# Patient Record
Sex: Female | Born: 1990 | Hispanic: No | Marital: Single | Smoking: Never smoker
Health system: Southern US, Community
[De-identification: ages and names within clinical notes are randomized; demographics above are authoritative.]

## PROBLEM LIST (undated history)

## (undated) ENCOUNTER — Inpatient Hospital Stay (HOSPITAL_COMMUNITY): Payer: Self-pay

## (undated) DIAGNOSIS — Z789 Other specified health status: Secondary | ICD-10-CM

## (undated) HISTORY — DX: Other specified health status: Z78.9

## (undated) HISTORY — PX: NO PAST SURGERIES: SHX2092

---

## 2012-01-31 NOTE — L&D Delivery Note (Signed)
Delivery Note At 3:09 AM a healthy female was delivered via Vaginal, Spontaneous Delivery (Presentation:ROA; ).  APGAR: 9, 9; weight 6 lb 11.1 oz (3036 g).   Placenta status: Intact, Spontaneous.  Cord: 3 vessels with the following complications: None.  Cord pH:n/a  Anesthesia: None  Episiotomy: None Lacerations: None Suture Repair: n/a Est. Blood Loss (mL): 300  Mom to postpartum.  Baby to nursery-stable.  Kevin Fenton 04/24/2012, 4:27 AM

## 2012-01-31 NOTE — L&D Delivery Note (Signed)
I have seen and examined this patient and I agree with the above. Cam Hai 7:50 AM 04/24/2012

## 2012-04-08 ENCOUNTER — Inpatient Hospital Stay (HOSPITAL_COMMUNITY)
Admission: AD | Admit: 2012-04-08 | Discharge: 2012-04-08 | Discharge status: Against Medical Advice | Payer: Self-pay | Source: Ambulatory Visit | Attending: Obstetrics & Gynecology | Admitting: Obstetrics & Gynecology

## 2012-04-08 NOTE — MAU Note (Signed)
Patient is not in the lobby when called to triage.  

## 2012-04-08 NOTE — MAU Note (Signed)
Patient is not in the lobby when called to triage. Admissions staff informed me that she left and will come back another day.

## 2012-04-24 ENCOUNTER — Encounter (HOSPITAL_COMMUNITY): Payer: Self-pay | Admitting: *Deleted

## 2012-04-24 ENCOUNTER — Inpatient Hospital Stay (HOSPITAL_COMMUNITY)
Admission: AD | Admit: 2012-04-24 | Discharge: 2012-04-25 | DRG: 775 | Disposition: A | Discharge status: Home / Self Care | Payer: Medicaid Other | Source: Ambulatory Visit | Attending: Family Medicine | Admitting: Family Medicine

## 2012-04-24 LAB — RAPID HIV SCREEN (WH-MAU): Rapid HIV Screen: NONREACTIVE

## 2012-04-24 LAB — CBC
Hemoglobin: 9.7 g/dL — ABNORMAL LOW (ref 12.0–15.0)
MCH: 29.5 pg (ref 26.0–34.0)
MCV: 86.3 fL (ref 78.0–100.0)
RBC: 3.29 MIL/uL — ABNORMAL LOW (ref 3.87–5.11)

## 2012-04-24 LAB — RAPID URINE DRUG SCREEN, HOSP PERFORMED: Opiates: NOT DETECTED

## 2012-04-24 LAB — TYPE AND SCREEN
ABO/RH(D): O POS
Antibody Screen: NEGATIVE

## 2012-04-24 MED ORDER — DIBUCAINE 1 % RE OINT: 1.0000 "application " | TOPICAL_OINTMENT | RECTAL | Status: DC | PRN

## 2012-04-24 MED ORDER — LACTATED RINGERS IV SOLN: 500.0000 mL | INTRAVENOUS | Status: DC | PRN

## 2012-04-24 MED ORDER — ZOLPIDEM TARTRATE 5 MG PO TABS: 5.0000 mg | ORAL_TABLET | Freq: Every evening | ORAL | Status: DC | PRN

## 2012-04-24 MED ORDER — ONDANSETRON HCL 4 MG PO TABS: 4.0000 mg | ORAL_TABLET | ORAL | Status: DC | PRN

## 2012-04-24 MED ORDER — WITCH HAZEL-GLYCERIN EX PADS: 1.0000 "application " | MEDICATED_PAD | CUTANEOUS | Status: DC | PRN

## 2012-04-24 MED ORDER — LANOLIN HYDROUS EX OINT: TOPICAL_OINTMENT | CUTANEOUS | Status: DC | PRN

## 2012-04-24 MED ORDER — DIPHENHYDRAMINE HCL 25 MG PO CAPS: 25.0000 mg | ORAL_CAPSULE | Freq: Four times a day (QID) | ORAL | Status: DC | PRN

## 2012-04-24 MED ORDER — FENTANYL CITRATE 0.05 MG/ML IJ SOLN
100.0000 ug | INTRAMUSCULAR | Status: DC | PRN
  Administered 2012-04-24: 100 ug via INTRAVENOUS
  Filled 2012-04-24: qty 2

## 2012-04-24 MED ORDER — PRENATAL MULTIVITAMIN CH
1.0000 | ORAL_TABLET | Freq: Every day | ORAL | Status: DC
  Administered 2012-04-24 – 2012-04-25 (×2): 1 via ORAL
  Filled 2012-04-24 (×2): qty 1

## 2012-04-24 MED ORDER — OXYCODONE-ACETAMINOPHEN 5-325 MG PO TABS
1.0000 | ORAL_TABLET | ORAL | Status: DC | PRN
  Administered 2012-04-24 (×3): 1 via ORAL
  Filled 2012-04-24 (×3): qty 1

## 2012-04-24 MED ORDER — ACETAMINOPHEN 325 MG PO TABS: 650.0000 mg | ORAL_TABLET | ORAL | Status: DC | PRN

## 2012-04-24 MED ORDER — OXYTOCIN BOLUS FROM INFUSION
500.0000 mL | INTRAVENOUS | Status: DC
  Administered 2012-04-24: 500 mL via INTRAVENOUS

## 2012-04-24 MED ORDER — SENNOSIDES-DOCUSATE SODIUM 8.6-50 MG PO TABS
2.0000 | ORAL_TABLET | Freq: Every day | ORAL | Status: DC
  Administered 2012-04-24: 2 via ORAL

## 2012-04-24 MED ORDER — CITRIC ACID-SODIUM CITRATE 334-500 MG/5ML PO SOLN: 30.0000 mL | ORAL | Status: DC | PRN

## 2012-04-24 MED ORDER — IBUPROFEN 600 MG PO TABS
600.0000 mg | ORAL_TABLET | Freq: Four times a day (QID) | ORAL | Status: DC
  Administered 2012-04-24 – 2012-04-25 (×5): 600 mg via ORAL
  Filled 2012-04-24 (×5): qty 1

## 2012-04-24 MED ORDER — OXYTOCIN 40 UNITS IN LACTATED RINGERS INFUSION - SIMPLE MED
62.5000 mL/h | INTRAVENOUS | Status: DC
  Filled 2012-04-24: qty 1000

## 2012-04-24 MED ORDER — LIDOCAINE HCL (PF) 1 % IJ SOLN
30.0000 mL | INTRAMUSCULAR | Status: DC | PRN
  Filled 2012-04-24 (×2): qty 30

## 2012-04-24 MED ORDER — SODIUM CHLORIDE 0.9 % IV SOLN
2.0000 g | Freq: Four times a day (QID) | INTRAVENOUS | Status: DC
  Administered 2012-04-24: 2 g via INTRAVENOUS
  Filled 2012-04-24 (×3): qty 2000

## 2012-04-24 MED ORDER — ONDANSETRON HCL 4 MG/2ML IJ SOLN: 4.0000 mg | Freq: Four times a day (QID) | INTRAMUSCULAR | Status: DC | PRN

## 2012-04-24 MED ORDER — OXYCODONE-ACETAMINOPHEN 5-325 MG PO TABS: 1.0000 | ORAL_TABLET | ORAL | Status: DC | PRN

## 2012-04-24 MED ORDER — LACTATED RINGERS IV SOLN
INTRAVENOUS | Status: DC
  Administered 2012-04-24: 02:00:00 via INTRAVENOUS

## 2012-04-24 MED ORDER — ONDANSETRON HCL 4 MG/2ML IJ SOLN: 4.0000 mg | INTRAMUSCULAR | Status: DC | PRN

## 2012-04-24 MED ORDER — TETANUS-DIPHTH-ACELL PERTUSSIS 5-2.5-18.5 LF-MCG/0.5 IM SUSP: 0.5000 mL | Freq: Once | INTRAMUSCULAR | Status: DC

## 2012-04-24 MED ORDER — IBUPROFEN 600 MG PO TABS
600.0000 mg | ORAL_TABLET | Freq: Four times a day (QID) | ORAL | Status: DC | PRN
  Administered 2012-04-24: 600 mg via ORAL
  Filled 2012-04-24: qty 1

## 2012-04-24 MED ORDER — SIMETHICONE 80 MG PO CHEW: 80.0000 mg | CHEWABLE_TABLET | ORAL | Status: DC | PRN

## 2012-04-24 MED ORDER — BENZOCAINE-MENTHOL 20-0.5 % EX AERO: 1.0000 "application " | INHALATION_SPRAY | CUTANEOUS | Status: DC | PRN

## 2012-04-24 NOTE — Progress Notes (Signed)
CSW referral received to assess pt's reason for NPNC. Pt told CSW that she was unable to obtain insurance, as she does not have a Green Card. She denies any illegal substance use, after CSW explained hospital drug testing policy. UDS is negative, meconium results are pending. Pt has all the necessary supplies for the infant & appears to be bonding appropriately. FOB, Christopher Bryant was at the bedside & supportive. Pt has WIC & Food Stamp benefits. CSW will continue to monitor drug screen results & make a referral if needed.      

## 2012-04-24 NOTE — H&P (Signed)
Heather Obrien is a 22 y.o. female G2P1 presenting for labor at 39 weeks by 17 week [place]. Her contractions started around 10 this morning. She has had some bloody mucousy discharge today. She denies LOF and decreased fetal movement. She recently moved from [place] where she did not get pre-natal care. She says that she had a 17 week Korea which was completely normal. She last ate around 6 pm. She denies other medical problems or problems during this pregnancy and denies problems during her last pregnancy.    History OB History   Grav Para Term Preterm Abortions TAB SAB Ect Mult Living   2              History reviewed. No pertinent past medical history. History reviewed. No pertinent past surgical history. Family History: family history is not on file. Social History:  reports that she has never smoked. She does not have any smokeless tobacco history on file. She reports that she does not drink alcohol or use illicit drugs.   Prenatal Transfer Tool  Maternal Diabetes: No Genetic Screening: Declined Maternal Ultrasounds/Referrals: Normal Fetal Ultrasounds or other Referrals:  None Maternal Substance Abuse:  No Significant Maternal Medications:  None Significant Maternal Lab Results:  None Other Comments:  Per mother's History, no pre-natal care  ROS Per HPI  Dilation: 6.5 Effacement (%): 90 Station: 0 Exam by:: B.Bethea RN There were no vitals taken for this visit. Exam Physical Exam   Gen: NAD, alert, cooperative with exam HEENT: NCAT CV: RRR, good S1/S2, no murmur Resp: CTABL, no wheezes, non-labored Abd: soft, pregnant abdomen Ext: No edema Neuro: Alert and oriented, No gross deficits  FHT: baseline 130, moderate variability, accels present, no decels Toco: regular q 3 minutes  Prenatal labs: ABO, Rh:   All pending currently Antibody:   Rubella:   RPR:    HBsAg:    HIV:    GBS:     Assessment/Plan: 22 y/o G2P0 here in active labor with no prenatal care -  Active labor - category 1 fetal strip - OB panel, UDS pending - Pain: Fentanyl PRN, Epidural by request - GBS unknown, no allergies, Treat with ampicillin - Anticipate SVD   Kevin Fenton 04/24/2012, 2:19 AM  I have seen and examined this patient and I agree with the above.  Rapid HIV: NR  CBC    Component Value Date/Time   WBC 8.1 04/24/2012 0240   RBC 3.29* 04/24/2012 0240   HGB 9.7* 04/24/2012 0240   HCT 28.4* 04/24/2012 0240   PLT 203 04/24/2012 0240   MCV 86.3 04/24/2012 0240   MCH 29.5 04/24/2012 0240   MCHC 34.2 04/24/2012 0240   RDW 13.7 04/24/2012 0240   Other labs pending. Has received a dose of Amp, but will not give another as is GBS unk at term by 17wk scan. Cam Hai 3:27 AM 04/24/2012

## 2012-04-24 NOTE — Progress Notes (Signed)
UR chart review completed.  

## 2012-04-24 NOTE — H&P (Signed)
Chart reviewed and agree with management and plan.  

## 2012-04-25 MED ORDER — INFLUENZA VIRUS VACC SPLIT PF IM SUSP
0.5000 mL | INTRAMUSCULAR | Status: AC
  Administered 2012-04-25: 0.5 mL via INTRAMUSCULAR

## 2012-04-25 MED ORDER — IBUPROFEN 600 MG PO TABS: 600.0000 mg | ORAL_TABLET | Freq: Four times a day (QID) | ORAL | Status: DC

## 2012-04-25 NOTE — Discharge Summary (Signed)
Obstetric Discharge Summary Pt reports some tenderness in her belly, particularly after baby has been laying on her breastfeeding for a while. Back is sore from being in bed. Otherwise no concerns. Pt is ready to go home. She does not live in [place] and came here because FOB's family is here. Will not be here in 6 weeks for a follow-up.  Reason for Admission: onset of labor Prenatal Procedures: none Intrapartum Procedures: spontaneous vaginal delivery Postpartum Procedures: none Complications-Operative and Postpartum: none Hemoglobin  Date Value Range Status  04/24/2012 9.7* 12.0 - 15.0 g/dL Final     HCT  Date Value Range Status  04/24/2012 28.4* 36.0 - 46.0 % Final    Physical Exam:  General: alert, cooperative, appears stated age and no distress Lochia: appropriate Uterine Fundus: firm Incision: n/a DVT Evaluation: No evidence of DVT seen on physical exam.  Discharge Diagnoses: Term Pregnancy-delivered  Discharge Information: Date: 04/25/2012 Activity: unrestricted Diet: routine Medications: Ibuprofen Condition: stable Instructions: refer to practice specific booklet Discharge to: home   Newborn Data: Live born female  Birth Weight: 6 lb 11.1 oz (3036 g) APGAR: 9, 9  Home with mother. Recommended trying different positions while breastfeeding and alternating breasts to help with belly soreness. Recommended walking in the halls to help alleviate back pain Follow-up in 6 weeks with a provider where pt is currently living  Depo shot in 6 weeks. Practice other forms of birth control in the meantime.  Heather Obrien 04/25/2012, 7:28 AM  I have seen and examined this patient and agree the above assessment. Heather Obrien,Heather Obrien 04/25/2012 7:44 AM

## 2013-01-30 NOTE — L&D Delivery Note (Addendum)
Delivery Note At 9:00 PM a viable female was delivered via Vaginal, Spontaneous Delivery (Presentation: Left Occiput Anterior).  APGAR: 9, 9; weight 7 lb 1.2 oz (3210 g).   Placenta status: Intact, Spontaneous.  Cord: 3 vessels with the following complications: None.  Cord pH: n/a  Anesthesia: None  Episiotomy: None Lacerations: None Suture Repair: n/a Est. Blood Loss (mL): 300  Mom to postpartum.  Baby to Couplet care / Skin to Skin.  Jacquiline Doearker, Boots Mcglown 12/19/2013, 1:12 PM

## 2013-09-11 ENCOUNTER — Other Ambulatory Visit (HOSPITAL_COMMUNITY)
Admission: RE | Admit: 2013-09-11 | Discharge: 2013-09-11 | Disposition: A | Discharge status: Home / Self Care | Payer: Medicaid Other | Source: Ambulatory Visit | Attending: Obstetrics and Gynecology | Admitting: Obstetrics and Gynecology

## 2013-09-11 ENCOUNTER — Ambulatory Visit (INDEPENDENT_AMBULATORY_CARE_PROVIDER_SITE_OTHER): Payer: Medicaid Other | Admitting: Obstetrics and Gynecology

## 2013-09-11 ENCOUNTER — Ambulatory Visit (HOSPITAL_COMMUNITY)
Admission: RE | Admit: 2013-09-11 | Discharge: 2013-09-11 | Disposition: A | Discharge status: Home / Self Care | Payer: Medicaid Other | Source: Ambulatory Visit | Attending: Obstetrics and Gynecology | Admitting: Obstetrics and Gynecology

## 2013-09-11 ENCOUNTER — Encounter: Payer: Self-pay | Admitting: Obstetrics and Gynecology

## 2013-09-11 VITALS — BP 100/51 | HR 82 | Temp 98.0°F | Wt 114.8 lb

## 2013-09-11 DIAGNOSIS — Z3480 Encounter for supervision of other normal pregnancy, unspecified trimester: Secondary | ICD-10-CM

## 2013-09-11 DIAGNOSIS — Z3492 Encounter for supervision of normal pregnancy, unspecified, second trimester: Secondary | ICD-10-CM | POA: Insufficient documentation

## 2013-09-11 DIAGNOSIS — Z113 Encounter for screening for infections with a predominantly sexual mode of transmission: Secondary | ICD-10-CM | POA: Diagnosis present

## 2013-09-11 DIAGNOSIS — Z01419 Encounter for gynecological examination (general) (routine) without abnormal findings: Secondary | ICD-10-CM | POA: Insufficient documentation

## 2013-09-11 DIAGNOSIS — O0932 Supervision of pregnancy with insufficient antenatal care, second trimester: Secondary | ICD-10-CM

## 2013-09-11 DIAGNOSIS — Z3689 Encounter for other specified antenatal screening: Secondary | ICD-10-CM | POA: Diagnosis present

## 2013-09-11 DIAGNOSIS — Z1389 Encounter for screening for other disorder: Secondary | ICD-10-CM

## 2013-09-11 DIAGNOSIS — Z3201 Encounter for pregnancy test, result positive: Secondary | ICD-10-CM

## 2013-09-11 DIAGNOSIS — O0930 Supervision of pregnancy with insufficient antenatal care, unspecified trimester: Secondary | ICD-10-CM

## 2013-09-11 LAB — POCT URINALYSIS DIP (DEVICE)
BILIRUBIN URINE: NEGATIVE
Glucose, UA: NEGATIVE mg/dL
Hgb urine dipstick: NEGATIVE
Ketones, ur: NEGATIVE mg/dL
NITRITE: NEGATIVE
PH: 5.5 (ref 5.0–8.0)
PROTEIN: NEGATIVE mg/dL
Specific Gravity, Urine: 1.01 (ref 1.005–1.030)
Urobilinogen, UA: 0.2 mg/dL (ref 0.0–1.0)

## 2013-09-11 LAB — POCT PREGNANCY, URINE: Preg Test, Ur: POSITIVE — AB

## 2013-09-11 LAB — OB RESULTS CONSOLE GBS: STREP GROUP B AG: POSITIVE

## 2013-09-11 NOTE — Progress Notes (Signed)
Pt scheduled for anatomy u/s 09/11/13 @ 3:30pm

## 2013-09-11 NOTE — Patient Instructions (Signed)
Second Trimester of Pregnancy The second trimester is from week 13 through week 28, months 4 through 6. The second trimester is often a time when you feel your best. Your body has also adjusted to being pregnant, and you begin to feel better physically. Usually, morning sickness has lessened or quit completely, you may have more energy, and you may have an increase in appetite. The second trimester is also a time when the fetus is growing rapidly. At the end of the sixth month, the fetus is about 9 inches long and weighs about 1 pounds. You will likely begin to feel the baby move (quickening) between 18 and 20 weeks of the pregnancy. BODY CHANGES Your body goes through many changes during pregnancy. The changes vary from woman to woman.   Your weight will continue to increase. You will notice your lower abdomen bulging out.  You may begin to get stretch marks on your hips, abdomen, and breasts.  You may develop headaches that can be relieved by medicines approved by your health care provider.  You may urinate more often because the fetus is pressing on your bladder.  You may develop or continue to have heartburn as a result of your pregnancy.  You may develop constipation because certain hormones are causing the muscles that push waste through your intestines to slow down.  You may develop hemorrhoids or swollen, bulging veins (varicose veins).  You may have back pain because of the weight gain and pregnancy hormones relaxing your joints between the bones in your pelvis and as a result of a shift in weight and the muscles that support your balance.  Your breasts will continue to grow and be tender.  Your gums may bleed and may be sensitive to brushing and flossing.  Dark spots or blotches (chloasma, mask of pregnancy) may develop on your face. This will likely fade after the baby is born.  A dark line from your belly button to the pubic area (linea nigra) may appear. This will likely  fade after the baby is born.  You may have changes in your hair. These can include thickening of your hair, rapid growth, and changes in texture. Some women also have hair loss during or after pregnancy, or hair that feels dry or thin. Your hair will most likely return to normal after your baby is born. WHAT TO EXPECT AT YOUR PRENATAL VISITS During a routine prenatal visit:  You will be weighed to make sure you and the fetus are growing normally.  Your blood pressure will be taken.  Your abdomen will be measured to track your baby's growth.  The fetal heartbeat will be listened to.  Any test results from the previous visit will be discussed. Your health care provider may ask you:  How you are feeling.  If you are feeling the baby move.  If you have had any abnormal symptoms, such as leaking fluid, bleeding, severe headaches, or abdominal cramping.  If you have any questions. Other tests that may be performed during your second trimester include:  Blood tests that check for:  Low iron levels (anemia).  Gestational diabetes (between 24 and 28 weeks).  Rh antibodies.  Urine tests to check for infections, diabetes, or protein in the urine.  An ultrasound to confirm the proper growth and development of the baby.  An amniocentesis to check for possible genetic problems.  Fetal screens for spina bifida and Down syndrome. HOME CARE INSTRUCTIONS   Avoid all smoking, herbs, alcohol, and unprescribed   drugs. These chemicals affect the formation and growth of the baby.  Follow your health care provider's instructions regarding medicine use. There are medicines that are either safe or unsafe to take during pregnancy.  Exercise only as directed by your health care provider. Experiencing uterine cramps is a good sign to stop exercising.  Continue to eat regular, healthy meals.  Wear a good support bra for breast tenderness.  Do not use hot tubs, steam rooms, or saunas.  Wear  your seat belt at all times when driving.  Avoid raw meat, uncooked cheese, cat litter boxes, and soil used by cats. These carry germs that can cause birth defects in the baby.  Take your prenatal vitamins.  Try taking a stool softener (if your health care provider approves) if you develop constipation. Eat more high-fiber foods, such as fresh vegetables or fruit and whole grains. Drink plenty of fluids to keep your urine clear or pale yellow.  Take warm sitz baths to soothe any pain or discomfort caused by hemorrhoids. Use hemorrhoid cream if your health care provider approves.  If you develop varicose veins, wear support hose. Elevate your feet for 15 minutes, 3-4 times a day. Limit salt in your diet.  Avoid heavy lifting, wear low heel shoes, and practice good posture.  Rest with your legs elevated if you have leg cramps or low back pain.  Visit your dentist if you have not gone yet during your pregnancy. Use a soft toothbrush to brush your teeth and be gentle when you floss.  A sexual relationship may be continued unless your health care provider directs you otherwise.  Continue to go to all your prenatal visits as directed by your health care provider. SEEK MEDICAL CARE IF:   You have dizziness.  You have mild pelvic cramps, pelvic pressure, or nagging pain in the abdominal area.  You have persistent nausea, vomiting, or diarrhea.  You have a bad smelling vaginal discharge.  You have pain with urination. SEEK IMMEDIATE MEDICAL CARE IF:   You have a fever.  You are leaking fluid from your vagina.  You have spotting or bleeding from your vagina.  You have severe abdominal cramping or pain.  You have rapid weight gain or loss.  You have shortness of breath with chest pain.  You notice sudden or extreme swelling of your face, hands, ankles, feet, or legs.  You have not felt your baby move in over an hour.  You have severe headaches that do not go away with  medicine.  You have vision changes. Document Released: 01/10/2001 Document Revised: 01/21/2013 Document Reviewed: 03/19/2012 [employer] Patient Information 2015 [employer]. This information is not intended to replace advice given to you by your health care provider. Make sure you discuss any questions you have with your health care provider.  Contraception Choices Contraception (birth control) is the use of any methods or devices to prevent pregnancy. Below are some methods to help avoid pregnancy. HORMONAL METHODS   Contraceptive implant. This is a thin, plastic tube containing progesterone hormone. It does not contain estrogen hormone. Your health care provider inserts the tube in the inner part of the upper arm. The tube can remain in place for up to 3 years. After 3 years, the implant must be removed. The implant prevents the ovaries from releasing an egg (ovulation), thickens the cervical mucus to prevent sperm from entering the uterus, and thins the lining of the inside of the uterus.  Progesterone-only injections. These injections are given   every 3 months by your health care provider to prevent pregnancy. This synthetic progesterone hormone stops the ovaries from releasing eggs. It also thickens cervical mucus and changes the uterine lining. This makes it harder for sperm to survive in the uterus.  Birth control pills. These pills contain estrogen and progesterone hormone. They work by preventing the ovaries from releasing eggs (ovulation). They also cause the cervical mucus to thicken, preventing the sperm from entering the uterus. Birth control pills are prescribed by a health care provider.Birth control pills can also be used to treat heavy periods.  Minipill. This type of birth control pill contains only the progesterone hormone. They are taken every day of each month and must be prescribed by your health care provider.  Birth control patch. The patch contains hormones similar to  those in birth control pills. It must be changed once a week and is prescribed by a health care provider.  Vaginal ring. The ring contains hormones similar to those in birth control pills. It is left in the vagina for 3 weeks, removed for 1 week, and then a new one is put back in place. The patient must be comfortable inserting and removing the ring from the vagina.A health care provider's prescription is necessary.  Emergency contraception. Emergency contraceptives prevent pregnancy after unprotected sexual intercourse. This pill can be taken right after sex or up to 5 days after unprotected sex. It is most effective the sooner you take the pills after having sexual intercourse. Most emergency contraceptive pills are available without a prescription. Check with your pharmacist. Do not use emergency contraception as your only form of birth control. BARRIER METHODS   Female condom. This is a thin sheath (latex or rubber) that is worn over the penis during sexual intercourse. It can be used with spermicide to increase effectiveness.  Female condom. This is a soft, loose-fitting sheath that is put into the vagina before sexual intercourse.  Diaphragm. This is a soft, latex, dome-shaped barrier that must be fitted by a health care provider. It is inserted into the vagina, along with a spermicidal jelly. It is inserted before intercourse. The diaphragm should be left in the vagina for 6 to 8 hours after intercourse.  Cervical cap. This is a round, soft, latex or plastic cup that fits over the cervix and must be fitted by a health care provider. The cap can be left in place for up to 48 hours after intercourse.  Sponge. This is a soft, circular piece of polyurethane foam. The sponge has spermicide in it. It is inserted into the vagina after wetting it and before sexual intercourse.  Spermicides. These are chemicals that kill or block sperm from entering the cervix and uterus. They come in the form of  creams, jellies, suppositories, foam, or tablets. They do not require a prescription. They are inserted into the vagina with an applicator before having sexual intercourse. The process must be repeated every time you have sexual intercourse. INTRAUTERINE CONTRACEPTION  Intrauterine device (IUD). This is a T-shaped device that is put in a woman's uterus during a menstrual period to prevent pregnancy. There are 2 types:  Copper IUD. This type of IUD is wrapped in copper wire and is placed inside the uterus. Copper makes the uterus and fallopian tubes produce a fluid that kills sperm. It can stay in place for 10 years.  Hormone IUD. This type of IUD contains the hormone progestin (synthetic progesterone). The hormone thickens the cervical mucus and prevents sperm from   entering the uterus, and it also thins the uterine lining to prevent implantation of a fertilized egg. The hormone can weaken or kill the sperm that get into the uterus. It can stay in place for 3-5 years, depending on which type of IUD is used. PERMANENT METHODS OF CONTRACEPTION  Female tubal ligation. This is when the woman's fallopian tubes are surgically sealed, tied, or blocked to prevent the egg from traveling to the uterus.  Hysteroscopic sterilization. This involves placing a small coil or insert into each fallopian tube. Your doctor uses a technique called hysteroscopy to do the procedure. The device causes scar tissue to form. This results in permanent blockage of the fallopian tubes, so the sperm cannot fertilize the egg. It takes about 3 months after the procedure for the tubes to become blocked. You must use another form of birth control for these 3 months.  Female sterilization. This is when the female has the tubes that carry sperm tied off (vasectomy).This blocks sperm from entering the vagina during sexual intercourse. After the procedure, the man can still ejaculate fluid (semen). NATURAL PLANNING METHODS  Natural family  planning. This is not having sexual intercourse or using a barrier method (condom, diaphragm, cervical cap) on days the woman could become pregnant.  Calendar method. This is keeping track of the length of each menstrual cycle and identifying when you are fertile.  Ovulation method. This is avoiding sexual intercourse during ovulation.  Symptothermal method. This is avoiding sexual intercourse during ovulation, using a thermometer and ovulation symptoms.  Post-ovulation method. This is timing sexual intercourse after you have ovulated. Regardless of which type or method of contraception you choose, it is important that you use condoms to protect against the transmission of sexually transmitted infections (STIs). Talk with your health care provider about which form of contraception is most appropriate for you. Document Released: 01/16/2005 Document Revised: 01/21/2013 Document Reviewed: 07/11/2012 [employer] Patient Information 2015 [employer]. This information is not intended to replace advice given to you by your health care provider. Make sure you discuss any questions you have with your health care provider.  Breastfeeding Deciding to breastfeed is one of the best choices you can make for you and your baby. A change in hormones during pregnancy causes your breast tissue to grow and increases the number and size of your milk ducts. These hormones also allow proteins, sugars, and fats from your blood supply to make breast milk in your milk-producing glands. Hormones prevent breast milk from being released before your baby is born as well as prompt milk flow after birth. Once breastfeeding has begun, thoughts of your baby, as well as his or her sucking or crying, can stimulate the release of milk from your milk-producing glands.  BENEFITS OF BREASTFEEDING For Your Baby  Your first milk (colostrum) helps your baby's digestive system function better.   There are antibodies in your milk that  help your baby fight off infections.   Your baby has a lower incidence of asthma, allergies, and sudden infant death syndrome.   The nutrients in breast milk are better for your baby than infant formulas and are designed uniquely for your baby's needs.   Breast milk improves your baby's brain development.   Your baby is less likely to develop other conditions, such as childhood obesity, asthma, or type 2 diabetes mellitus.  For You   Breastfeeding helps to create a very special bond between you and your baby.   Breastfeeding is convenient. Breast milk is   always available at the correct temperature and costs nothing.   Breastfeeding helps to burn calories and helps you lose the weight gained during pregnancy.   Breastfeeding makes your uterus contract to its prepregnancy size faster and slows bleeding (lochia) after you give birth.   Breastfeeding helps to lower your risk of developing type 2 diabetes mellitus, osteoporosis, and breast or ovarian cancer later in life. SIGNS THAT YOUR BABY IS HUNGRY Early Signs of Hunger  Increased alertness or activity.  Stretching.  Movement of the head from side to side.  Movement of the head and opening of the mouth when the corner of the mouth or cheek is stroked (rooting).  Increased sucking sounds, smacking lips, cooing, sighing, or squeaking.  Hand-to-mouth movements.  Increased sucking of fingers or hands. Late Signs of Hunger  Fussing.  Intermittent crying. Extreme Signs of Hunger Signs of extreme hunger will require calming and consoling before your baby will be able to breastfeed successfully. Do not wait for the following signs of extreme hunger to occur before you initiate breastfeeding:   Restlessness.  A loud, strong cry.   Screaming. BREASTFEEDING BASICS Breastfeeding Initiation  Find a comfortable place to sit or lie down, with your neck and back well supported.  Place a pillow or rolled up blanket  under your baby to bring him or her to the level of your breast (if you are seated). Nursing pillows are specially designed to help support your arms and your baby while you breastfeed.  Make sure that your baby's abdomen is facing your abdomen.   Gently massage your breast. With your fingertips, massage from your chest wall toward your nipple in a circular motion. This encourages milk flow. You may need to continue this action during the feeding if your milk flows slowly.  Support your breast with 4 fingers underneath and your thumb above your nipple. Make sure your fingers are well away from your nipple and your baby's mouth.   Stroke your baby's lips gently with your finger or nipple.   When your baby's mouth is open wide enough, quickly bring your baby to your breast, placing your entire nipple and as much of the colored area around your nipple (areola) as possible into your baby's mouth.   More areola should be visible above your baby's upper lip than below the lower lip.   Your baby's tongue should be between his or her lower gum and your breast.   Ensure that your baby's mouth is correctly positioned around your nipple (latched). Your baby's lips should create a seal on your breast and be turned out (everted).  It is common for your baby to suck about 2-3 minutes in order to start the flow of breast milk. Latching Teaching your baby how to latch on to your breast properly is very important. An improper latch can cause nipple pain and decreased milk supply for you and poor weight gain in your baby. Also, if your baby is not latched onto your nipple properly, he or she may swallow some air during feeding. This can make your baby fussy. Burping your baby when you switch breasts during the feeding can help to get rid of the air. However, teaching your baby to latch on properly is still the best way to prevent fussiness from swallowing air while breastfeeding. Signs that your baby has  successfully latched on to your nipple:    Silent tugging or silent sucking, without causing you pain.   Swallowing heard between every 3-4   sucks.    Muscle movement above and in front of his or her ears while sucking.  Signs that your baby has not successfully latched on to nipple:   Sucking sounds or smacking sounds from your baby while breastfeeding.  Nipple pain. If you think your baby has not latched on correctly, slip your finger into the corner of your baby's mouth to break the suction and place it between your baby's gums. Attempt breastfeeding initiation again. Signs of Successful Breastfeeding Signs from your baby:   A gradual decrease in the number of sucks or complete cessation of sucking.   Falling asleep.   Relaxation of his or her body.   Retention of a small amount of milk in his or her mouth.   Letting go of your breast by himself or herself. Signs from you:  Breasts that have increased in firmness, weight, and size 1-3 hours after feeding.   Breasts that are softer immediately after breastfeeding.  Increased milk volume, as well as a change in milk consistency and color by the fifth day of breastfeeding.   Nipples that are not sore, cracked, or bleeding. Signs That Your Baby is Getting Enough Milk  Wetting at least 3 diapers in a 24-hour period. The urine should be clear and pale yellow by age 5 days.  At least 3 stools in a 24-hour period by age 5 days. The stool should be soft and yellow.  At least 3 stools in a 24-hour period by age 7 days. The stool should be seedy and yellow.  No loss of weight greater than 10% of birth weight during the first 3 days of age.  Average weight gain of 4-7 ounces (113-198 g) per week after age 4 days.  Consistent daily weight gain by age 5 days, without weight loss after the age of 2 weeks. After a feeding, your baby may spit up a small amount. This is common. BREASTFEEDING FREQUENCY AND DURATION Frequent  feeding will help you make more milk and can prevent sore nipples and breast engorgement. Breastfeed when you feel the need to reduce the fullness of your breasts or when your baby shows signs of hunger. This is called "breastfeeding on demand." Avoid introducing a pacifier to your baby while you are working to establish breastfeeding (the first 4-6 weeks after your baby is born). After this time you may choose to use a pacifier. Research has shown that pacifier use during the first year of a baby's life decreases the risk of sudden infant death syndrome (SIDS). Allow your baby to feed on each breast as long as he or she wants. Breastfeed until your baby is finished feeding. When your baby unlatches or falls asleep while feeding from the first breast, offer the second breast. Because newborns are often sleepy in the first few weeks of life, you may need to awaken your baby to get him or her to feed. Breastfeeding times will vary from baby to baby. However, the following rules can serve as a guide to help you ensure that your baby is properly fed:  Newborns (babies 4 weeks of age or younger) may breastfeed every 1-3 hours.  Newborns should not go longer than 3 hours during the day or 5 hours during the night without breastfeeding.  You should breastfeed your baby a minimum of 8 times in a 24-hour period until you begin to introduce solid foods to your baby at around 6 months of age. BREAST MILK PUMPING Pumping and storing breast milk allows   you to ensure that your baby is exclusively fed your breast milk, even at times when you are unable to breastfeed. This is especially important if you are going back to work while you are still breastfeeding or when you are not able to be present during feedings. Your lactation consultant can give you guidelines on how long it is safe to store breast milk.  A breast pump is a machine that allows you to pump milk from your breast into a sterile bottle. The pumped breast  milk can then be stored in a refrigerator or freezer. Some breast pumps are operated by hand, while others use electricity. Ask your lactation consultant which type will work best for you. Breast pumps can be purchased, but some hospitals and breastfeeding support groups lease breast pumps on a monthly basis. A lactation consultant can teach you how to hand express breast milk, if you prefer not to use a pump.  CARING FOR YOUR BREASTS WHILE YOU BREASTFEED Nipples can become dry, cracked, and sore while breastfeeding. The following recommendations can help keep your breasts moisturized and healthy:  Avoid using soap on your nipples.   Wear a supportive bra. Although not required, special nursing bras and tank tops are designed to allow access to your breasts for breastfeeding without taking off your entire bra or top. Avoid wearing underwire-style bras or extremely tight bras.  Air dry your nipples for 3-4minutes after each feeding.   Use only cotton bra pads to absorb leaked breast milk. Leaking of breast milk between feedings is normal.   Use lanolin on your nipples after breastfeeding. Lanolin helps to maintain your skin's normal moisture barrier. If you use pure lanolin, you do not need to wash it off before feeding your baby again. Pure lanolin is not toxic to your baby. You may also hand express a few drops of breast milk and gently massage that milk into your nipples and allow the milk to air dry. In the first few weeks after giving birth, some women experience extremely full breasts (engorgement). Engorgement can make your breasts feel heavy, warm, and tender to the touch. Engorgement peaks within 3-5 days after you give birth. The following recommendations can help ease engorgement:  Completely empty your breasts while breastfeeding or pumping. You may want to start by applying warm, moist heat (in the shower or with warm water-soaked hand towels) just before feeding or pumping. This  increases circulation and helps the milk flow. If your baby does not completely empty your breasts while breastfeeding, pump any extra milk after he or she is finished.  Wear a snug bra (nursing or regular) or tank top for 1-2 days to signal your body to slightly decrease milk production.  Apply ice packs to your breasts, unless this is too uncomfortable for you.  Make sure that your baby is latched on and positioned properly while breastfeeding. If engorgement persists after 48 hours of following these recommendations, contact your health care provider or a lactation consultant. OVERALL HEALTH CARE RECOMMENDATIONS WHILE BREASTFEEDING  Eat healthy foods. Alternate between meals and snacks, eating 3 of each per day. Because what you eat affects your breast milk, some of the foods may make your baby more irritable than usual. Avoid eating these foods if you are sure that they are negatively affecting your baby.  Drink milk, fruit juice, and water to satisfy your thirst (about 10 glasses a day).   Rest often, relax, and continue to take your prenatal vitamins to prevent fatigue,   stress, and anemia.  Continue breast self-awareness checks.  Avoid chewing and smoking tobacco.  Avoid alcohol and drug use. Some medicines that may be harmful to your baby can pass through breast milk. It is important to ask your health care provider before taking any medicine, including all over-the-counter and prescription medicine as well as vitamin and herbal supplements. It is possible to become pregnant while breastfeeding. If birth control is desired, ask your health care provider about options that will be safe for your baby. SEEK MEDICAL CARE IF:   You feel like you want to stop breastfeeding or have become frustrated with breastfeeding.  You have painful breasts or nipples.  Your nipples are cracked or bleeding.  Your breasts are red, tender, or warm.  You have a swollen area on either breast.  You  have a fever or chills.  You have nausea or vomiting.  You have drainage other than breast milk from your nipples.  Your breasts do not become full before feedings by the fifth day after you give birth.  You feel sad and depressed.  Your baby is too sleepy to eat well.  Your baby is having trouble sleeping.   Your baby is wetting less than 3 diapers in a 24-hour period.  Your baby has less than 3 stools in a 24-hour period.  Your baby's skin or the white part of his or her eyes becomes yellow.   Your baby is not gaining weight by 5 days of age. SEEK IMMEDIATE MEDICAL CARE IF:   Your baby is overly tired (lethargic) and does not want to wake up and feed.  Your baby develops an unexplained fever. Document Released: 01/16/2005 Document Revised: 01/21/2013 Document Reviewed: 07/10/2012 [employer] Patient Information 2015 [employer]. This information is not intended to replace advice given to you by your health care provider. Make sure you discuss any questions you have with your health care provider.  

## 2013-09-11 NOTE — Progress Notes (Signed)
   Subjective:    Heather Obrien is a [id number] 7323w5d being seen today for her first obstetrical visit.  Her obstetrical history is significant for late to prenatal care. Patient does not intend to breast feed. Pregnancy history fully reviewed.  Patient reports no complaints.  Filed Vitals:   09/11/13 1033 09/11/13 1037  BP: 87/56 100/51  Pulse: 82   Temp: 98 F (36.7 C)   Weight: 114 lb 12.8 oz (52.073 kg)     HISTORY: OB History  Gravida Para Term Preterm AB SAB TAB Ectopic Multiple Living  3 2 2       2     # Outcome Date GA Lbr Len/2nd Weight Sex Delivery Anes PTL Lv  3 CUR           2 TRM 04/24/12 7064w6d 16:53 / 00:16 6 lb 11.1 oz (3.036 kg) M SVD None  Y     Comments: WNL  1 TRM 01/29/11 3124w0d  6 lb 15 oz (3.147 kg) F SVD None  Y     Past Medical History  Diagnosis Date  . Medical history non-contributory    Past Surgical History  Procedure Laterality Date  . No past surgeries     Family History  Problem Relation Age of Onset  . Diabetes Mother      Exam    Uterus:     Pelvic Exam:    Perineum: No Hemorrhoids, Normal Perineum   Vulva: normal   Vagina:  normal mucosa, normal discharge   pH:    Cervix: closed and long   Adnexa: normal adnexa and no mass, fullness, tenderness   Bony Pelvis: android  System: Breast:  normal appearance, no masses or tenderness   Skin: normal coloration and turgor, no rashes    Neurologic: oriented, no focal deficits   Extremities: normal strength, tone, and muscle mass   HEENT extra ocular movement intact   Mouth/Teeth mucous membranes moist, pharynx normal without lesions and dental hygiene good   Neck supple and no masses   Cardiovascular: regular rate and rhythm   Respiratory:  chest clear, no wheezing, crepitations, rhonchi, normal symmetric air entry   Abdomen: soft, gravid   Urinary:       Assessment:    Pregnancy: [id number] Patient Active Problem List   Diagnosis Date Noted  . Supervision of normal pregnancy  in second trimester 09/11/2013  . Insufficient prenatal care 09/11/2013        Plan:     Initial labs drawn. Prenatal vitamins. Problem list reviewed and updated. Genetic Screening discussed Quad Screen: too late.  Ultrasound discussed; fetal survey: ordered.  Follow up in 4 weeks. 50% of 30 min visit spent on counseling and coordination of care.     Heather Obrien 09/11/2013

## 2013-09-11 NOTE — Progress Notes (Signed)
Nutrition note: 1st visit consult Pt has gained 11.8# @ 665w5d, which is < expected. Pt reports eating 3 meals & 3-4 snacks/d. Pt has not started taking a PNV yet. Pt reports no N/V but has some heartburn. NKFA. Pt received verbal & written education on general nutrition during pregnancy. Discussed tips to decrease heartburn. Encouraged protein with all meals & snacks. Encouraged PNV. Discussed wt gain goals of 28-40# or 1#/wk. Pt agrees to start taking a PNV.  Pt has WIC & plans to BF. F/u in 4-6 wks [name], MS, RD, LDN, [facility] Center[facility]

## 2013-09-12 ENCOUNTER — Encounter: Payer: Self-pay | Admitting: Obstetrics and Gynecology

## 2013-09-12 LAB — OBSTETRIC PANEL
ANTIBODY SCREEN: NEGATIVE
BASOS ABS: 0 10*3/uL (ref 0.0–0.1)
BASOS PCT: 0 % (ref 0–1)
EOS ABS: 0.3 10*3/uL (ref 0.0–0.7)
EOS PCT: 3 % (ref 0–5)
HEMATOCRIT: 28.6 % — AB (ref 36.0–46.0)
HEMOGLOBIN: 9.7 g/dL — AB (ref 12.0–15.0)
Hepatitis B Surface Ag: NEGATIVE
Lymphocytes Relative: 25 % (ref 12–46)
Lymphs Abs: 2.1 10*3/uL (ref 0.7–4.0)
MCH: 30 pg (ref 26.0–34.0)
MCHC: 33.9 g/dL (ref 30.0–36.0)
MCV: 88.5 fL (ref 78.0–100.0)
MONO ABS: 0.4 10*3/uL (ref 0.1–1.0)
MONOS PCT: 5 % (ref 3–12)
Neutro Abs: 5.7 10*3/uL (ref 1.7–7.7)
Neutrophils Relative %: 67 % (ref 43–77)
Platelets: 209 10*3/uL (ref 150–400)
RBC: 3.23 MIL/uL — ABNORMAL LOW (ref 3.87–5.11)
RDW: 13.8 % (ref 11.5–15.5)
RH TYPE: POSITIVE
Rubella: 21.6 Index — ABNORMAL HIGH (ref <0.90)
WBC: 8.5 10*3/uL (ref 4.0–10.5)

## 2013-09-12 LAB — HIV ANTIBODY (ROUTINE TESTING W REFLEX): HIV: NONREACTIVE

## 2013-09-12 LAB — GLUCOSE TOLERANCE, 1 HOUR (50G) W/O FASTING: GLUCOSE 1 HOUR GTT: 56 mg/dL — AB (ref 70–140)

## 2013-09-14 LAB — CULTURE, OB URINE

## 2013-09-15 LAB — HEMOGLOBINOPATHY EVALUATION
HGB A2 QUANT: 2.7 % (ref 2.2–3.2)
HGB A: 97 % (ref 96.8–97.8)
Hemoglobin Other: 0 %
Hgb F Quant: 0.3 % (ref 0.0–2.0)
Hgb S Quant: 0 %

## 2013-09-15 LAB — CANNABANOIDS (GC/LC/MS), URINE: THC-COOH (GC/LC/MS), ur confirm: 130 ng/mL — AB (ref <5)

## 2013-09-15 LAB — CYTOLOGY - PAP

## 2013-09-16 LAB — PRESCRIPTION MONITORING PROFILE (19 PANEL)
AMPHETAMINE/METH: NEGATIVE ng/mL
BARBITURATE SCREEN, URINE: NEGATIVE ng/mL
BENZODIAZEPINE SCREEN, URINE: NEGATIVE ng/mL
Buprenorphine, Urine: NEGATIVE ng/mL
CARISOPRODOL, URINE: NEGATIVE ng/mL
Cocaine Metabolites: NEGATIVE ng/mL
Creatinine, Urine: 186.24 mg/dL (ref >20.0)
ECSTASY: NEGATIVE ng/mL
Fentanyl, Ur: NEGATIVE ng/mL
MEPERIDINE UR: NEGATIVE ng/mL
Methadone Screen, Urine: NEGATIVE ng/mL
Methaqualone: NEGATIVE ng/mL
NITRITES URINE, INITIAL: NEGATIVE ug/mL
Opiate Screen, Urine: NEGATIVE ng/mL
Oxycodone Screen, Ur: NEGATIVE ng/mL
PH URINE, INITIAL: 5.5 pH (ref 4.5–8.9)
PROPOXYPHENE: NEGATIVE ng/mL
Phencyclidine, Ur: NEGATIVE ng/mL
TAPENTADOLUR: NEGATIVE ng/mL
TRAMADOL UR: NEGATIVE ng/mL
ZOLPIDEM, URINE: NEGATIVE ng/mL

## 2013-09-25 ENCOUNTER — Encounter (HOSPITAL_COMMUNITY): Payer: Self-pay | Admitting: Emergency Medicine

## 2013-09-25 DIAGNOSIS — M5430 Sciatica, unspecified side: Secondary | ICD-10-CM | POA: Insufficient documentation

## 2013-09-25 DIAGNOSIS — Z79899 Other long term (current) drug therapy: Secondary | ICD-10-CM | POA: Insufficient documentation

## 2013-09-25 DIAGNOSIS — O9989 Other specified diseases and conditions complicating pregnancy, childbirth and the puerperium: Secondary | ICD-10-CM | POA: Diagnosis not present

## 2013-09-25 DIAGNOSIS — M545 Low back pain, unspecified: Secondary | ICD-10-CM | POA: Insufficient documentation

## 2013-09-25 LAB — POC URINE PREG, ED: Preg Test, Ur: POSITIVE — AB

## 2013-09-25 NOTE — ED Notes (Addendum)
Presents 7 months pregnant-with right sided back pain pain is worse with sitting and standing pain radiates down right leg and stops at knee. HX of same since 2013. denies abdominal pain, abdominal pressure and pelvic pain. Denies pain with urination. Pt states, "This is not labor pain, I have been in labor before. I always get back pain when I am pregnant"  This back pain began "a really long time ago, with my first pregnancy. This is pregnancy number 3"

## 2013-09-26 ENCOUNTER — Emergency Department (HOSPITAL_COMMUNITY)
Admission: EM | Admit: 2013-09-26 | Discharge: 2013-09-26 | Disposition: A | Discharge status: Home / Self Care | Payer: Medicaid Other | Attending: Emergency Medicine | Admitting: Emergency Medicine

## 2013-09-26 DIAGNOSIS — Z3492 Encounter for supervision of normal pregnancy, unspecified, second trimester: Secondary | ICD-10-CM

## 2013-09-26 DIAGNOSIS — M5431 Sciatica, right side: Secondary | ICD-10-CM

## 2013-09-26 LAB — URINALYSIS, ROUTINE W REFLEX MICROSCOPIC
BILIRUBIN URINE: NEGATIVE
Glucose, UA: NEGATIVE mg/dL
HGB URINE DIPSTICK: NEGATIVE
Ketones, ur: NEGATIVE mg/dL
Leukocytes, UA: NEGATIVE
Nitrite: NEGATIVE
PROTEIN: NEGATIVE mg/dL
Specific Gravity, Urine: 1.009 (ref 1.005–1.030)
UROBILINOGEN UA: 0.2 mg/dL (ref 0.0–1.0)
pH: 6 (ref 5.0–8.0)

## 2013-09-26 MED ORDER — OXYCODONE-ACETAMINOPHEN 5-325 MG PO TABS: 1.0000 | ORAL_TABLET | ORAL | Status: DC | PRN

## 2013-09-26 NOTE — Discharge Instructions (Signed)
Take acetaminophen as needed for less severe pain. Do not take ibuprofen or naproxen while you are pregnant.  Back Pain in Pregnancy Back pain during pregnancy is common. It happens in about half of all pregnancies. It is important for you and your baby that you remain active during your pregnancy.If you feel that back pain is not allowing you to remain active or sleep well, it is time to see your caregiver. Back pain may be caused by several factors related to changes during your pregnancy.Fortunately, unless you had trouble with your back before your pregnancy, the pain is likely to get better after you deliver. Low back pain usually occurs between the fifth and seventh months of pregnancy. It can, however, happen in the first couple months. Factors that increase the risk of back problems include:   Previous back problems.  Injury to your back.  Having twins or multiple births.  A chronic cough.  Stress.  Job-related repetitive motions.  Muscle or spinal disease in the back.  Family history of back problems, ruptured (herniated) discs, or osteoporosis.  Depression, anxiety, and panic attacks. CAUSES   When you are pregnant, your body produces a hormone called relaxin. This hormonemakes the ligaments connecting the low back and pubic bones more flexible. This flexibility allows the baby to be delivered more easily. When your ligaments are loose, your muscles need to work harder to support your back. Soreness in your back can come from tired muscles. Soreness can also come from back tissues that are irritated since they are receiving less support.  As the baby grows, it puts pressure on the nerves and blood vessels in your pelvis. This can cause back pain.  As the baby grows and gets heavier during pregnancy, the uterus pushes the stomach muscles forward and changes your center of gravity. This makes your back muscles work harder to maintain good posture. SYMPTOMS  Lumbar pain  during pregnancy Lumbar pain during pregnancy usually occurs at or above the waist in the center of the back. There may be pain and numbness that radiates into your leg or foot. This is similar to low back pain experienced by non-pregnant women. It usually increases with sitting for long periods of time, standing, or repetitive lifting. Tenderness may also be present in the muscles along your upper back. Posterior pelvic pain during pregnancy Pain in the back of the pelvis is more common than lumbar pain in pregnancy. It is a deep pain felt in your side at the waistline, or across the tailbone (sacrum), or in both places. You may have pain on one or both sides. This pain can also go into the buttocks and backs of the upper thighs. Pubic and groin pain may also be present. The pain does not quickly resolve with rest, and morning stiffness may also be present. Pelvic pain during pregnancy can be brought on by most activities. A high level of fitness before and during pregnancy may or may not prevent this problem. Labor pain is usually 1 to 2 minutes apart, lasts for about 1 minute, and involves a bearing down feeling or pressure in your pelvis. However, if you are at term with the pregnancy, constant low back pain can be the beginning of early labor, and you should be aware of this. DIAGNOSIS  X-rays of the back should not be done during the first 12 to 14 weeks of the pregnancy and only when absolutely necessary during the rest of the pregnancy. MRIs do not give off radiation  and are safe during pregnancy. MRIs also should only be done when absolutely necessary. HOME CARE INSTRUCTIONS  Exercise as directed by your caregiver. Exercise is the most effective way to prevent or manage back pain. If you have a back problem, it is especially important to avoid sports that require sudden body movements. Swimming and walking are great activities.  Do not stand in one place for long periods of time.  Do not wear  high heels.  Sit in chairs with good posture. Use a pillow on your lower back if necessary. Make sure your head rests over your shoulders and is not hanging forward.  Try sleeping on your side, preferably the left side, with a pillow or two between your legs. If you are sore after a night's rest, your bedmay betoo soft.Try placing a board between your mattress and box spring.  Listen to your body when lifting.If you are experiencing pain, ask for help or try bending yourknees more so you can use your leg muscles rather than your back muscles. Squat down when picking up something from the floor. Do not bend over.  Eat a healthy diet. Try to gain weight within your caregiver's recommendations.  Use heat or cold packs 3 to 4 times a day for 15 minutes to help with the pain.  Only take over-the-counter or prescription medicines for pain, discomfort, or fever as directed by your caregiver. Sudden (acute) back pain  Use bed rest for only the most extreme, acute episodes of back pain. Prolonged bed rest over 48 hours will aggravate your condition.  Ice is very effective for acute conditions.  Put ice in a plastic bag.  Place a towel between your skin and the bag.  Leave the ice on for 10 to 20 minutes every 2 hours, or as needed.  Using heat packs for 30 minutes prior to activities is also helpful. Continued back pain See your caregiver if you have continued problems. Your caregiver can help or refer you for appropriate physical therapy. With conditioning, most back problems can be avoided. Sometimes, a more serious issue may be the cause of back pain. You should be seen right away if new problems seem to be developing. Your caregiver may recommend:  A maternity girdle.  An elastic sling.  A back brace.  A massage therapist or acupuncture. SEEK MEDICAL CARE IF:   You are not able to do most of your daily activities, even when taking the pain medicine you were given.  You need a  referral to a physical therapist or chiropractor.  You want to try acupuncture. SEEK IMMEDIATE MEDICAL CARE IF:  You develop numbness, tingling, weakness, or problems with the use of your arms or legs.  You develop severe back pain that is no longer relieved with medicines.  You have a sudden change in bowel or bladder control.  You have increasing pain in other areas of the body.  You develop shortness of breath, dizziness, or fainting.  You develop nausea, vomiting, or sweating.  You have back pain which is similar to labor pains.  You have back pain along with your water breaking or vaginal bleeding.  You have back pain or numbness that travels down your leg.  Your back pain developed after you fell.  You develop pain on one side of your back. You may have a kidney stone.  You see blood in your urine. You may have a bladder infection or kidney stone.  You have back pain with blisters. You  may have shingles. Back pain is fairly common during pregnancy but should not be accepted as just part of the process. Back pain should always be treated as soon as possible. This will make your pregnancy as pleasant as possible. Document Released: 04/26/2005 Document Revised: 04/10/2011 Document Reviewed: 06/07/2010 [facility] Patient Information 2015 [place], [place]. This information is not intended to replace advice given to you by your health care provider. Make sure you discuss any questions you have with your health care provider.  Acetaminophen; Oxycodone tablets What is this medicine? ACETAMINOPHEN; OXYCODONE (a set a MEE noe fen; ox i KOE done) is a pain reliever. It is used to treat mild to moderate pain. This medicine may be used for other purposes; ask your health care provider or pharmacist if you have questions. COMMON BRAND NAME(S): Endocet, Magnacet, Narvox, Percocet, Perloxx, Primalev, Primlev, Roxicet, Xolox What should I tell my health care provider before I take this  medicine? They need to know if you have any of these conditions: -brain tumor -Crohn's disease, inflammatory bowel disease, or ulcerative colitis -drug abuse or addiction -head injury -heart or circulation problems -if you often drink alcohol -kidney disease or problems going to the bathroom -liver disease -lung disease, asthma, or breathing problems -an unusual or allergic reaction to acetaminophen, oxycodone, other opioid analgesics, other medicines, foods, dyes, or preservatives -pregnant or trying to get pregnant -breast-feeding How should I use this medicine? Take this medicine by mouth with a full glass of water. Follow the directions on the prescription label. Take your medicine at regular intervals. Do not take your medicine more often than directed. Talk to your pediatrician regarding the use of this medicine in children. Special care may be needed. Patients over 17 years old may have a stronger reaction and need a smaller dose. Overdosage: If you think you have taken too much of this medicine contact a poison control center or emergency room at once. NOTE: This medicine is only for you. Do not share this medicine with others. What if I miss a dose? If you miss a dose, take it as soon as you can. If it is almost time for your next dose, take only that dose. Do not take double or extra doses. What may interact with this medicine? -alcohol -antihistamines -barbiturates like amobarbital, butalbital, butabarbital, methohexital, pentobarbital, phenobarbital, thiopental, and secobarbital -benztropine -drugs for bladder problems like solifenacin, trospium, oxybutynin, tolterodine, hyoscyamine, and methscopolamine -drugs for breathing problems like ipratropium and tiotropium -drugs for certain stomach or intestine problems like propantheline, homatropine methylbromide, glycopyrrolate, atropine, belladonna, and dicyclomine -general anesthetics like etomidate, ketamine, nitrous oxide,  propofol, desflurane, enflurane, halothane, isoflurane, and sevoflurane -medicines for depression, anxiety, or psychotic disturbances -medicines for sleep -muscle relaxants -naltrexone -narcotic medicines (opiates) for pain -phenothiazines like perphenazine, thioridazine, chlorpromazine, mesoridazine, fluphenazine, prochlorperazine, promazine, and trifluoperazine -scopolamine -tramadol -trihexyphenidyl This list may not describe all possible interactions. Give your health care provider a list of all the medicines, herbs, non-prescription drugs, or dietary supplements you use. Also tell them if you smoke, drink alcohol, or use illegal drugs. Some items may interact with your medicine. What should I watch for while using this medicine? Tell your doctor or health care professional if your pain does not go away, if it gets worse, or if you have new or a different type of pain. You may develop tolerance to the medicine. Tolerance means that you will need a higher dose of the medication for pain relief. Tolerance is normal and is expected if you  take this medicine for a long time. Do not suddenly stop taking your medicine because you may develop a severe reaction. Your body becomes used to the medicine. This does NOT mean you are addicted. Addiction is a behavior related to getting and using a drug for a non-medical reason. If you have pain, you have a medical reason to take pain medicine. Your doctor will tell you how much medicine to take. If your doctor wants you to stop the medicine, the dose will be slowly lowered over time to avoid any side effects. You may get drowsy or dizzy. Do not drive, use machinery, or do anything that needs mental alertness until you know how this medicine affects you. Do not stand or sit up quickly, especially if you are an older patient. This reduces the risk of dizzy or fainting spells. Alcohol may interfere with the effect of this medicine. Avoid alcoholic drinks. There  are different types of narcotic medicines (opiates) for pain. If you take more than one type at the same time, you may have more side effects. Give your health care provider a list of all medicines you use. Your doctor will tell you how much medicine to take. Do not take more medicine than directed. Call emergency for help if you have problems breathing. The medicine will cause constipation. Try to have a bowel movement at least every 2 to 3 days. If you do not have a bowel movement for 3 days, call your doctor or health care professional. Do not take Tylenol (acetaminophen) or medicines that have acetaminophen with this medicine. Too much acetaminophen can be very dangerous. Many nonprescription medicines contain acetaminophen. Always read the labels carefully to avoid taking more acetaminophen. What side effects may I notice from receiving this medicine? Side effects that you should report to your doctor or health care professional as soon as possible: -allergic reactions like skin rash, itching or hives, swelling of the face, lips, or tongue -breathing difficulties, wheezing -confusion -light headedness or fainting spells -severe stomach pain -unusually weak or tired -yellowing of the skin or the whites of the eyes Side effects that usually do not require medical attention (report to your doctor or health care professional if they continue or are bothersome): -dizziness -drowsiness -nausea -vomiting This list may not describe all possible side effects. Call your doctor for medical advice about side effects. You may report side effects to FDA at 1-800-FDA-1088. Where should I keep my medicine? Keep out of the reach of children. This medicine can be abused. Keep your medicine in a safe place to protect it from theft. Do not share this medicine with anyone. Selling or giving away this medicine is dangerous and against the law. Store at room temperature between 20 and 25 degrees C (68 and 77  degrees F). Keep container tightly closed. Protect from light. This medicine may cause accidental overdose and death if it is taken by other adults, children, or pets. Flush any unused medicine down the toilet to reduce the chance of harm. Do not use the medicine after the expiration date. NOTE: This sheet is a summary. It may not cover all possible information. If you have questions about this medicine, talk to your doctor, pharmacist, or health care provider.  2015, [employer]. (2012-09-09 13:17:35)

## 2013-09-26 NOTE — ED Notes (Signed)
Upon entering patients room patient a significant other were curled up together on the stretcher. No distress noted with patient.

## 2013-09-26 NOTE — ED Notes (Signed)
Patient discharged with all personal belongings. 

## 2013-09-26 NOTE — ED Provider Notes (Signed)
CSN: [id number]     Arrival date & time 09/25/13  2308 History   First MD Initiated Contact with Patient 09/26/13 0246     Chief Complaint  Patient presents with  . Back Pain     (Consider location/radiation/quality/duration/timing/severity/associated sxs/prior Treatment) Patient is a 23 y.o. female presenting with back pain. The history is provided by the patient.  Back Pain She is about 7 months pregnant and is complaining of pain in her lower back. She states that she gets back pain whenever she is pregnant but this has been worse than normal. Pain started about 2 months ago. It is worse when she stands. At rest, pain is 2/10 but it didn't so severe as 8/10 when she stands. There is some mild numbness going down her right leg. She denies any bowel or bladder dysfunction. Pregnancy has been otherwise uncomplicated. She has started prenatal care. She has not taken anything for her pain.  Past Medical History  Diagnosis Date  . Medical history non-contributory    Past Surgical History  Procedure Laterality Date  . No past surgeries     Family History  Problem Relation Age of Onset  . Diabetes Mother    History  Substance Use Topics  . Smoking status: Never Smoker   . Smokeless tobacco: Never Used  . Alcohol Use: No   OB History   Grav Para Term Preterm Abortions TAB SAB Ect Mult Living   Review of Systems  Musculoskeletal: Positive for back pain.  All other systems reviewed and are negative.     Allergies  Review of patient's allergies indicates no known allergies.  Home Medications   Prior to Admission medications   Medication Sig Start Date End Date Taking? Authorizing Provider  Multiple Vitamin (MULTIVITAMIN WITH MINERALS) TABS Take 1 tablet by mouth daily.   Yes Historical Provider, MD   BP 93/50  Pulse 88  Temp(Src) 98.7 F (37.1 C) (Oral)  Resp 16  SpO2 100%  LMP 03/15/2013 Physical Exam  Nursing note and vitals reviewed.  23 year  old female, resting comfortably and in no acute distress. Vital signs are normal. Oxygen saturation is 100%, which is normal. Head is normocephalic and atraumatic. PERRLA, EOMI. Oropharynx is clear. Neck is nontender and supple without adenopathy or JVD. Back is mildly tender in the lower lumbar area. There is no palpable paralumbar spasm. Straight leg raise is positive on the right at 60. There is no CVA tenderness. Lungs are clear without rales, wheezes, or rhonchi. Chest is nontender. Heart has regular rate and rhythm without murmur. Abdomen is soft, flat, nontender without hepatosplenomegaly and peristalsis is normoactive. Uterine fundus is palpable and nontender and appropriate size for dates. Extremities have no cyanosis or edema, full range of motion is present. Skin is warm and dry without rash. Neurologic: Mental status is normal, cranial nerves are intact, there are no motor or sensory deficits.  ED Course  Procedures (including critical care time) Labs Review Results for orders placed during the hospital encounter of 09/26/13  URINALYSIS, ROUTINE W REFLEX MICROSCOPIC      Result Value Ref Range   Color, Urine YELLOW  YELLOW   APPearance CLEAR  CLEAR   Specific Gravity, Urine 1.009  1.005 - 1.030   pH 6.0  5.0 - 8.0   Glucose, UA NEGATIVE  NEGATIVE mg/dL   Hgb urine dipstick NEGATIVE  NEGATIVE   Bilirubin Urine NEGATIVE  NEGATIVE   Ketones, ur NEGATIVE  NEGATIVE mg/dL   Protein, ur NEGATIVE  NEGATIVE mg/dL   Urobilinogen, UA 0.2  0.0 - 1.0 mg/dL   Nitrite NEGATIVE  NEGATIVE   Leukocytes, UA NEGATIVE  NEGATIVE  POC URINE PREG, ED      Result Value Ref Range   Preg Test, Ur POSITIVE (*) NEGATIVE    MDM   Final diagnoses:  Back pain with right-sided sciatica  Second trimester pregnancy    Low back pain and pregnancy. No evidence of any neurologic compromise. No indication for imaging. She'll be treated symptomatically. She is advised to use acetaminophen for pain is  given a prescription for oxycodone and acetaminophen for pain not controlled with acetaminophen alone. Advised to avoid all NSAIDs. She is also advised to discuss her back pain have her obstetrician at her next prenatal visit.    Dione Booze, MD 09/26/13 [phone]

## 2013-10-08 ENCOUNTER — Encounter: Payer: Medicaid Other | Admitting: Obstetrics and Gynecology

## 2013-10-24 ENCOUNTER — Ambulatory Visit (INDEPENDENT_AMBULATORY_CARE_PROVIDER_SITE_OTHER): Payer: Medicaid Other | Admitting: Family Medicine

## 2013-10-24 VITALS — BP 93/62 | HR 84 | Temp 98.2°F | Wt 123.4 lb

## 2013-10-24 DIAGNOSIS — Z23 Encounter for immunization: Secondary | ICD-10-CM

## 2013-10-24 DIAGNOSIS — R12 Heartburn: Secondary | ICD-10-CM

## 2013-10-24 DIAGNOSIS — Z3480 Encounter for supervision of other normal pregnancy, unspecified trimester: Secondary | ICD-10-CM | POA: Diagnosis present

## 2013-10-24 DIAGNOSIS — O26893 Other specified pregnancy related conditions, third trimester: Secondary | ICD-10-CM

## 2013-10-24 DIAGNOSIS — Z3483 Encounter for supervision of other normal pregnancy, third trimester: Secondary | ICD-10-CM

## 2013-10-24 DIAGNOSIS — O9989 Other specified diseases and conditions complicating pregnancy, childbirth and the puerperium: Secondary | ICD-10-CM

## 2013-10-24 DIAGNOSIS — Z3492 Encounter for supervision of normal pregnancy, unspecified, second trimester: Secondary | ICD-10-CM

## 2013-10-24 LAB — POCT URINALYSIS DIP (DEVICE)
Bilirubin Urine: NEGATIVE
Glucose, UA: NEGATIVE mg/dL
HGB URINE DIPSTICK: NEGATIVE
Ketones, ur: NEGATIVE mg/dL
Nitrite: NEGATIVE
PROTEIN: NEGATIVE mg/dL
SPECIFIC GRAVITY, URINE: 1.015 (ref 1.005–1.030)
UROBILINOGEN UA: 1 mg/dL (ref 0.0–1.0)
pH: 7 (ref 5.0–8.0)

## 2013-10-24 MED ORDER — TETANUS-DIPHTH-ACELL PERTUSSIS 5-2.5-18.5 LF-MCG/0.5 IM SUSP
0.5000 mL | Freq: Once | INTRAMUSCULAR | Status: AC
  Administered 2013-10-24: 0.5 mL via INTRAMUSCULAR

## 2013-10-24 MED ORDER — RANITIDINE HCL 150 MG PO TABS: 150.0000 mg | ORAL_TABLET | Freq: Two times a day (BID) | ORAL | Status: AC

## 2013-10-24 NOTE — Progress Notes (Signed)
C/o contractions once in a while. C/o bad heartburn.

## 2013-10-24 NOTE — Patient Instructions (Signed)
Third Trimester of Pregnancy The third trimester is from week 29 through week 42, months 7 through 9. The third trimester is a time when the fetus is growing rapidly. At the end of the ninth month, the fetus is about 20 inches in length and weighs 6-10 pounds.  BODY CHANGES Your body goes through many changes during pregnancy. The changes vary from woman to woman.   Your weight will continue to increase. You can expect to gain 25-35 pounds (11-16 kg) by the end of the pregnancy.  You may begin to get stretch marks on your hips, abdomen, and breasts.  You may urinate more often because the fetus is moving lower into your pelvis and pressing on your bladder.  You may develop or continue to have heartburn as a result of your pregnancy.  You may develop constipation because certain hormones are causing the muscles that push waste through your intestines to slow down.  You may develop hemorrhoids or swollen, bulging veins (varicose veins).  You may have pelvic pain because of the weight gain and pregnancy hormones relaxing your joints between the bones in your pelvis. Backaches may result from overexertion of the muscles supporting your posture.  You may have changes in your hair. These can include thickening of your hair, rapid growth, and changes in texture. Some women also have hair loss during or after pregnancy, or hair that feels dry or thin. Your hair will most likely return to normal after your baby is born.  Your breasts will continue to grow and be tender. A yellow discharge may leak from your breasts called colostrum.  Your belly button may stick out.  You may feel short of breath because of your expanding uterus.  You may notice the fetus "dropping," or moving lower in your abdomen.  You may have a bloody mucus discharge. This usually occurs a few days to a week before labor begins.  Your cervix becomes thin and soft (effaced) near your due date. WHAT TO EXPECT AT YOUR PRENATAL  EXAMS  You will have prenatal exams every 2 weeks until week 36. Then, you will have weekly prenatal exams. During a routine prenatal visit:  You will be weighed to make sure you and the fetus are growing normally.  Your blood pressure is taken.  Your abdomen will be measured to track your baby's growth.  The fetal heartbeat will be listened to.  Any test results from the previous visit will be discussed.  You may have a cervical check near your due date to see if you have effaced. At around 36 weeks, your caregiver will check your cervix. At the same time, your caregiver will also perform a test on the secretions of the vaginal tissue. This test is to determine if a type of bacteria, Group B streptococcus, is present. Your caregiver will explain this further. Your caregiver may ask you:  What your birth plan is.  How you are feeling.  If you are feeling the baby move.  If you have had any abnormal symptoms, such as leaking fluid, bleeding, severe headaches, or abdominal cramping.  If you have any questions. Other tests or screenings that may be performed during your third trimester include:  Blood tests that check for low iron levels (anemia).  Fetal testing to check the health, activity level, and growth of the fetus. Testing is done if you have certain medical conditions or if there are problems during the pregnancy. FALSE LABOR You may feel small, irregular contractions that   eventually go away. These are called Braxton Hicks contractions, or false labor. Contractions may last for hours, days, or even weeks before true labor sets in. If contractions come at regular intervals, intensify, or become painful, it is best to be seen by your caregiver.  SIGNS OF LABOR   Menstrual-like cramps.  Contractions that are 5 minutes apart or less.  Contractions that start on the top of the uterus and spread down to the lower abdomen and back.  A sense of increased pelvic pressure or back  pain.  A watery or bloody mucus discharge that comes from the vagina. If you have any of these signs before the 37th week of pregnancy, call your caregiver right away. You need to go to the hospital to get checked immediately. HOME CARE INSTRUCTIONS   Avoid all smoking, herbs, alcohol, and unprescribed drugs. These chemicals affect the formation and growth of the baby.  Follow your caregiver's instructions regarding medicine use. There are medicines that are either safe or unsafe to take during pregnancy.  Exercise only as directed by your caregiver. Experiencing uterine cramps is a good sign to stop exercising.  Continue to eat regular, healthy meals.  Wear a good support bra for breast tenderness.  Do not use hot tubs, steam rooms, or saunas.  Wear your seat belt at all times when driving.  Avoid raw meat, uncooked cheese, cat litter boxes, and soil used by cats. These carry germs that can cause birth defects in the baby.  Take your prenatal vitamins.  Try taking a stool softener (if your caregiver approves) if you develop constipation. Eat more high-fiber foods, such as fresh vegetables or fruit and whole grains. Drink plenty of fluids to keep your urine clear or pale yellow.  Take warm sitz baths to soothe any pain or discomfort caused by hemorrhoids. Use hemorrhoid cream if your caregiver approves.  If you develop varicose veins, wear support hose. Elevate your feet for 15 minutes, 3-4 times a day. Limit salt in your diet.  Avoid heavy lifting, wear low heal shoes, and practice good posture.  Rest a lot with your legs elevated if you have leg cramps or low back pain.  Visit your dentist if you have not gone during your pregnancy. Use a soft toothbrush to brush your teeth and be gentle when you floss.  A sexual relationship may be continued unless your caregiver directs you otherwise.  Do not travel far distances unless it is absolutely necessary and only with the approval  of your caregiver.  Take prenatal classes to understand, practice, and ask questions about the labor and delivery.  Make a trial run to the hospital.  Pack your hospital bag.  Prepare the baby's nursery.  Continue to go to all your prenatal visits as directed by your caregiver. SEEK MEDICAL CARE IF:  You are unsure if you are in labor or if your water has broken.  You have dizziness.  You have mild pelvic cramps, pelvic pressure, or nagging pain in your abdominal area.  You have persistent nausea, vomiting, or diarrhea.  You have a bad smelling vaginal discharge.  You have pain with urination. SEEK IMMEDIATE MEDICAL CARE IF:   You have a fever.  You are leaking fluid from your vagina.  You have spotting or bleeding from your vagina.  You have severe abdominal cramping or pain.  You have rapid weight loss or gain.  You have shortness of breath with chest pain.  You notice sudden or extreme swelling   of your face, hands, ankles, feet, or legs.  You have not felt your baby move in over an hour.  You have severe headaches that do not go away with medicine.  You have vision changes. Document Released: 01/10/2001 Document Revised: 01/21/2013 Document Reviewed: 03/19/2012 ExitCare Patient Information 2015 [employer]. This information is not intended to replace advice given to you by your health care provider. Make sure you discuss any questions you have with your health care provider.  

## 2013-10-24 NOTE — Progress Notes (Signed)
Patient complains of heartburn.  Will prescribe Zantac.  Denies vaginal bleeding, abnormal vaginal discharge, contractions, loss of fluid.  Denies abdominal pain, headache, scotoma.  Reports good fetal activity.  Labor precautions reviewed.  Follow up in 2 weeks.

## 2013-10-31 ENCOUNTER — Encounter (HOSPITAL_COMMUNITY): Payer: Self-pay | Admitting: Emergency Medicine

## 2013-10-31 ENCOUNTER — Encounter: Payer: Self-pay | Admitting: General Practice

## 2013-10-31 ENCOUNTER — Emergency Department (HOSPITAL_COMMUNITY)
Admission: EM | Admit: 2013-10-31 | Discharge: 2013-10-31 | Discharge status: Against Medical Advice | Payer: Medicaid Other | Attending: Emergency Medicine | Admitting: Emergency Medicine

## 2013-10-31 DIAGNOSIS — M545 Low back pain: Secondary | ICD-10-CM | POA: Diagnosis not present

## 2013-10-31 DIAGNOSIS — O0933 Supervision of pregnancy with insufficient antenatal care, third trimester: Secondary | ICD-10-CM

## 2013-10-31 DIAGNOSIS — Z79899 Other long term (current) drug therapy: Secondary | ICD-10-CM | POA: Diagnosis not present

## 2013-10-31 DIAGNOSIS — O26893 Other specified pregnancy related conditions, third trimester: Secondary | ICD-10-CM | POA: Diagnosis present

## 2013-10-31 DIAGNOSIS — G8929 Other chronic pain: Secondary | ICD-10-CM | POA: Insufficient documentation

## 2013-10-31 DIAGNOSIS — Z3A33 33 weeks gestation of pregnancy: Secondary | ICD-10-CM | POA: Insufficient documentation

## 2013-10-31 DIAGNOSIS — O9989 Other specified diseases and conditions complicating pregnancy, childbirth and the puerperium: Secondary | ICD-10-CM

## 2013-10-31 DIAGNOSIS — Z3492 Encounter for supervision of normal pregnancy, unspecified, second trimester: Secondary | ICD-10-CM

## 2013-10-31 DIAGNOSIS — M549 Dorsalgia, unspecified: Secondary | ICD-10-CM

## 2013-10-31 LAB — URINALYSIS, ROUTINE W REFLEX MICROSCOPIC
Bilirubin Urine: NEGATIVE
GLUCOSE, UA: NEGATIVE mg/dL
HGB URINE DIPSTICK: NEGATIVE
KETONES UR: NEGATIVE mg/dL
Nitrite: NEGATIVE
PROTEIN: NEGATIVE mg/dL
Specific Gravity, Urine: 1.007 (ref 1.005–1.030)
Urobilinogen, UA: 1 mg/dL (ref 0.0–1.0)
pH: 7 (ref 5.0–8.0)

## 2013-10-31 LAB — URINE MICROSCOPIC-ADD ON

## 2013-10-31 MED ORDER — ACETAMINOPHEN 500 MG PO TABS: 1000.0000 mg | ORAL_TABLET | Freq: Once | ORAL | Status: DC

## 2013-10-31 NOTE — Progress Notes (Addendum)
1000 Arrived to evaluate this 23 yo G3P2 @ 32.[redacted] wks GA with complaint of back pain.  Reports back pain every time pregnant.  Reports back pain this "whole pregnancy". Denies contractions, vaginal bleeding, LOF, or abdominal pain or tenderness.  Reports good fetal movement. 1040  Patient continues to be non compliant with  EFM.  Will not lay back in bed for good tracing.  Educated on importance of EFM.1048  Call placed to Dr. Debroah LoopArnold [facility].  MD is currently in surgery, brief report given via OR nurse.  1100 Patient walked out AMA.

## 2013-10-31 NOTE — ED Notes (Signed)
Pt came to Nurses station stated "I need to go home to rest for work." Pt asked to go back into room for discharge. Pt states "I will just leave."

## 2013-10-31 NOTE — ED Notes (Addendum)
8 month ob pt c/o back pain  Had it for a while  Was seen here and states lost rx ,worse 3 days ago G 3 O2  L2 A 0 not sure thinks she saw ob dr 2 weeks ago

## 2013-10-31 NOTE — ED Notes (Signed)
Placed pt on OB monitor.

## 2013-10-31 NOTE — ED Notes (Signed)
[facility]. Pt states they didn't give her anything so [facility].

## 2013-10-31 NOTE — ED Notes (Signed)
OB rapid response nurse at bedside. 

## 2013-10-31 NOTE — Progress Notes (Signed)
Patient came by clinic today requesting pain medication for her back that started to hurt after starting work 3 days ago. Asked patient if she had taken tylenol for the pain to see if that would help and she stated no because she has taken that in the past with other pregnancies and it doesn't help. Encouraged patient to try tylenol as well as heating pad or hot shower to see if that helps with the pain. Also discussed with patient that we could take a urine sample today to make sure she doesn't have a urinary tract infection. Patient declined and states I will just go to the emergency room. Patient had no questions

## 2013-10-31 NOTE — ED Notes (Signed)
MD at bedside. 

## 2013-10-31 NOTE — ED Provider Notes (Signed)
CSN: [id number]     Arrival date & time 10/31/13  [id number] History   First MD Initiated Contact with Patient 10/31/13 [phone]     Chief Complaint  Patient presents with  . Back Pain  . Possible Pregnancy     (Consider location/radiation/quality/duration/timing/severity/associated sxs/prior Treatment) Patient is a 23 y.o. female presenting with back pain and pregnancy problem.  Back Pain Location:  Lumbar spine (right) Quality:  Shooting Radiates to: right leg. Pain severity:  Moderate Onset quality:  Gradual Duration:  33 weeks ("Since I got pregnant") Timing:  Constant Progression:  Worsening Chronicity:  Chronic Context comment:  Pt states pain has gotten a bit worse as her pregnancy has progressed and as she started a new job where she's on her feet more. Relieved by: lying down. Exacerbated by: standing, ambulating. Ineffective treatments: 650mg  tylenol yesterda. Associated symptoms: no abdominal pain, no bowel incontinence, no fever, no numbness, no paresthesias, no perianal numbness, no tingling and no weakness   Possible Pregnancy Patient reports no abdominal pain.  Associated symptoms include no fever and no weakness.    Past Medical History  Diagnosis Date  . Medical history non-contributory    Past Surgical History  Procedure Laterality Date  . No past surgeries     Family History  Problem Relation Age of Onset  . Diabetes Mother    History  Substance Use Topics  . Smoking status: Never Smoker   . Smokeless tobacco: Never Used  . Alcohol Use: No   OB History   Grav Para Term Preterm Abortions TAB SAB Ect Mult Living   3 2 2       2      Review of Systems  Constitutional: Negative for fever.  Gastrointestinal: Negative for abdominal pain and bowel incontinence.  Musculoskeletal: Positive for back pain.  Neurological: Negative for tingling, weakness, numbness and paresthesias.  All other systems reviewed and are negative.     Allergies  Review of  patient's allergies indicates no known allergies.  Home Medications   Prior to Admission medications   Medication Sig Start Date End Date Taking? Authorizing Provider  Multiple Vitamin (MULTIVITAMIN WITH MINERALS) TABS Take 1 tablet by mouth daily.   Yes Historical Provider, MD  ranitidine (ZANTAC) 150 MG tablet Take 1 tablet (150 mg total) by mouth 2 (two) times daily. 10/24/13  Yes Rhona RaiderJacob J Stinson, DO   BP 104/55  Pulse 84  Temp(Src) 98.5 F (36.9 C) (Oral)  Resp 16  SpO2 100%  LMP 03/15/2013 Physical Exam  Nursing note and vitals reviewed. Constitutional: She is oriented to person, place, and time. She appears well-developed and well-nourished. No distress.  HENT:  Head: Normocephalic and atraumatic.  Mouth/Throat: Oropharynx is clear and moist.  Eyes: Conjunctivae are normal. Pupils are equal, round, and reactive to light. No scleral icterus.  Neck: Neck supple.  Cardiovascular: Normal rate, regular rhythm, normal heart sounds and intact distal pulses.   No murmur heard. Pulmonary/Chest: Effort normal and breath sounds normal. No stridor. No respiratory distress. She has no rales.  Abdominal: Soft. Bowel sounds are normal. She exhibits no distension. There is no tenderness. There is no rigidity, no rebound and no guarding.  Gravid uterus  Musculoskeletal: Normal range of motion.       Back:  Neg straight leg raise test  Neurological: She is alert and oriented to person, place, and time.  Skin: Skin is warm and dry. No rash noted.  Psychiatric: She has a normal mood and affect. Her  behavior is normal.    ED Course  Procedures (including critical care time) Labs Review Labs Reviewed  URINALYSIS, ROUTINE W REFLEX MICROSCOPIC - Abnormal; Notable for the following:    Leukocytes, UA TRACE (*)    All other components within normal limits  URINE MICROSCOPIC-ADD ON - Abnormal; Notable for the following:    Squamous Epithelial / LPF FEW (*)    All other components within  normal limits    Imaging Review No results found.   EKG Interpretation None      MDM   Final diagnoses:  Supervision of normal pregnancy in second trimester  Insufficient prenatal care, third trimester    23 yo female at about [redacted] weeks gestation (pregnancy complicated by late prenatal care, positive UDS for marijuana) presenting with right low back pain.  Mild TTP of right lumbar/gluteus region.  No red flag signs or symptoms concerning for spinal cord pathology.  She denies vaginal bleeding, vaginal discharge, leakage of fluids. She is feeling the baby move.  She states that the only reason she came to the emergency department today was because she needed a new prescription for the pain medications prescribed to her about a month ago. She states she lost this original prescription. If this time, I don't think providing her with narcotic pain medications is in her or her unborn child's best interest. Will treat her uncomplicated back pain with Tylenol and other supportive measures. She should followup with her obstetrician as needed.  Pt ultimately eloped from the ED.  OB rapid response nurse evaluated her and thought that her strip looked reactive and saw no contractions.      Warnell Forester, MD 10/31/13 1726

## 2013-11-07 ENCOUNTER — Encounter: Payer: Medicaid Other | Admitting: Physician Assistant

## 2013-12-01 ENCOUNTER — Encounter (HOSPITAL_COMMUNITY): Payer: Self-pay | Admitting: Emergency Medicine

## 2013-12-09 ENCOUNTER — Inpatient Hospital Stay (HOSPITAL_COMMUNITY)
Admission: AD | Admit: 2013-12-09 | Discharge: 2013-12-09 | Disposition: A | Discharge status: Home / Self Care | Payer: Medicaid Other | Source: Ambulatory Visit | Attending: Obstetrics & Gynecology | Admitting: Obstetrics & Gynecology

## 2013-12-09 ENCOUNTER — Encounter (HOSPITAL_COMMUNITY): Payer: Self-pay | Admitting: *Deleted

## 2013-12-09 ENCOUNTER — Inpatient Hospital Stay (HOSPITAL_COMMUNITY): Payer: Medicaid Other

## 2013-12-09 DIAGNOSIS — Z3A38 38 weeks gestation of pregnancy: Secondary | ICD-10-CM | POA: Diagnosis not present

## 2013-12-09 DIAGNOSIS — O288 Other abnormal findings on antenatal screening of mother: Secondary | ICD-10-CM | POA: Insufficient documentation

## 2013-12-09 DIAGNOSIS — O479 False labor, unspecified: Secondary | ICD-10-CM

## 2013-12-09 DIAGNOSIS — O471 False labor at or after 37 completed weeks of gestation: Secondary | ICD-10-CM | POA: Diagnosis present

## 2013-12-09 NOTE — MAU Note (Signed)
Patient presents to MAU with c/o of contractions since 7am this morning. Denies LOF or VB at this time. Reports having lost mucus plug yesterday. +FM

## 2013-12-09 NOTE — MAU Provider Note (Signed)
Chief Complaint:  Labor Eval   Heather Obrien is a 23 y.o.  [id number] with IUP at 3953w3d presenting for Labor Eval initially had nonreactive NST.  +FM, denies LOF, VB, vaginal discharge.    Menstrual History: OB History    Gravida Para Term Preterm AB TAB SAB Ectopic Multiple Living   3 2 2       2       Patient's last menstrual period was 03/15/2013.      Past Medical History  Diagnosis Date  . Medical history non-contributory     Past Surgical History  Procedure Laterality Date  . No past surgeries      Family History  Problem Relation Age of Onset  . Diabetes Mother     History  Substance Use Topics  . Smoking status: Never Smoker   . Smokeless tobacco: Never Used  . Alcohol Use: No     No Known Allergies  Prescriptions prior to admission  Medication Sig Dispense Refill Last Dose  . acetaminophen (TYLENOL) 500 MG tablet Take 500 mg by mouth every 6 (six) hours as needed.   Past Week at Unknown time  . IRON PO Take by mouth.   12/08/2013 at Unknown time  . Prenatal Vit-Fe Fumarate-FA (PRENATAL MULTIVITAMIN) TABS tablet Take 1 tablet by mouth daily at 12 noon.   12/08/2013 at Unknown time  . Multiple Vitamin (MULTIVITAMIN WITH MINERALS) TABS Take 1 tablet by mouth daily.   10/30/2013 at Unknown time  . ranitidine (ZANTAC) 150 MG tablet Take 1 tablet (150 mg total) by mouth 2 (two) times daily. (Patient not taking: Reported on 12/09/2013) 90 tablet 1 10/30/2013 at Unknown time    Review of Systems - Negative except for what is mentioned in HPI.  Physical Exam  Blood pressure 111/65, pulse 84, temperature 97.8 F (36.6 C), temperature source Oral, resp. rate 16, height 5' 4.5" (1.638 m), weight 130 lb 6.4 oz (59.149 kg), last menstrual period 03/15/2013, SpO2 100 %, unknown if currently breastfeeding. GENERAL: Well-developed, well-nourished female in no acute distress. Resp: no distress Cardiac: normal rate Dilation: 2 Effacement (%): 60 Cervical Position:  Middle Station: -2 Presentation: Vertex Exam by:: Janeth Rasehristina Robinson RN     Labs: No results found for this or any previous visit (from the past 24 hour(s)).  Imaging Studies:  No results found.  Assessment: Heather Obrien is  23 y.o. G3P2002 at 5453w3d presents with Labor Eval and initially nonreactive NST which was subsequently reactive    Plan: - BPP 8/8 - labor precautions - PO hydration encouraged  Sunya Humbarger ROCIO 11/10/201511:29 AM

## 2013-12-09 NOTE — MAU Note (Signed)
Patient states she is having contractions but not sure how far apart they are. Denies bleeding or leaking and reports good fetal movement.

## 2013-12-09 NOTE — Discharge Instructions (Signed)
Normal Labor and Delivery ° °Your caregiver must first be sure you are in labor. Signs of labor include: ° °· You may pass what is called "the mucus plug" before labor begins. This is a small amount of blood stained mucus. °· Regular uterine contractions. °· The time between contractions get closer together. °· The discomfort and pain gradually gets more intense. °· Pains are mostly located in the back. °· Pains get worse when walking. °· The cervix (the opening of the uterus becomes thinner (begins to efface) and opens up (dilates). ° ° °Once you are in labor and admitted into the hospital or care center, your caregiver will do the following: ° °· A complete physical examination. °· Check your vital signs (blood pressure, pulse, temperature and the fetal heart rate). °· Do a vaginal examination (using a sterile glove and lubricant) to determine: °· The position (presentation) of the baby (head [vertex] or buttock first). °· The level (station) of the baby's head in the birth canal. °· The effacement and dilatation of the cervix. °· An electronic monitor is usually placed on your abdomen. The monitor follows the length and intensity of the contractions, as well as the baby's heart rate. °· your caregiver may insert an IV in your arm with a bottle of sugar water. This is done as a precaution so that medications can be given to you quickly during labor or delivery. ° ° °NORMAL LABOR AND DELIVERY IS DIVIDED UP INTO 3 STAGES: ° °First Stage °This is when regular contractions begin and the cervix begins to efface and dilate. This stage can last from 3 to 15 hours. The end of the first stage is when the cervix is 100% effaced and 10 centimeters dilated. Pain medications may be given by  °· Injection (morphine, demerol, etc.) °· Regional anesthesia (spinal, caudal or epidural, anesthetics given in different locations of the spine). Paracervical pain medication may be given, which is an injection of and anesthetic on each  side of the cervix. °A pregnant woman may request to have "Natural Childbirth" which is not to have any medications or anesthesia during her labor and delivery. ° °Second Stage °This is when the baby comes down through the birth canal (vagina) and is born. This can take 1 to 4 hours. As the baby's head comes down through the birth canal, you may feel like you are going to have a bowel movement. You will get the urge to bear down and push until the baby is delivered. As the baby's head is being delivered, the caregiver will decide if an episiotomy (a cut in the perineum and vagina area) is needed to prevent tearing of the tissue in this area. The episiotomy is sewn up after the delivery of the baby and placenta. Sometimes a mask with nitrous oxide is given for the mother to breath during the delivery of the baby to help if there is too much pain. The end of Stage 2 is when the baby is fully delivered. Then when the umbilical cord stops pulsating it is clamped and cut. ° °Third Stage °The third stage begins after the baby is completely delivered and ends after the placenta (afterbirth) is delivered. This usually takes 5 to 30 minutes. After the placenta is delivered, a medication is given either by intravenous or injection to help contract the uterus and prevent bleeding. The third stage is not painful and pain medication is usually not necessary. If there was a tear, it is repaired at this time. °  After the delivery, the mother is watched and monitored closely for 1 to 2 hours to make sure there is no postpartum bleeding (hemorrhage). If there is a lot of bleeding, medication is given to contract the uterus and stop the bleeding. ° ° °Document Released: 10/26/2007 Document Revised: 04/10/2011 Document Reviewed: 10/26/2007 °Exit[employer]® Patient Information ©2013 [employer]. ° ° °

## 2013-12-13 ENCOUNTER — Encounter (HOSPITAL_COMMUNITY): Payer: Self-pay | Admitting: Cardiology

## 2013-12-13 ENCOUNTER — Inpatient Hospital Stay (HOSPITAL_COMMUNITY)
Admission: EM | Admit: 2013-12-13 | Discharge: 2013-12-15 | DRG: 775 | Disposition: A | Discharge status: Home / Self Care | Payer: Medicaid Other | Attending: Obstetrics & Gynecology | Admitting: Obstetrics & Gynecology

## 2013-12-13 DIAGNOSIS — IMO0001 Reserved for inherently not codable concepts without codable children: Secondary | ICD-10-CM

## 2013-12-13 DIAGNOSIS — Z975 Presence of (intrauterine) contraceptive device: Secondary | ICD-10-CM | POA: Diagnosis not present

## 2013-12-13 DIAGNOSIS — D649 Anemia, unspecified: Secondary | ICD-10-CM | POA: Diagnosis present

## 2013-12-13 DIAGNOSIS — O9902 Anemia complicating childbirth: Secondary | ICD-10-CM | POA: Diagnosis present

## 2013-12-13 DIAGNOSIS — O99824 Streptococcus B carrier state complicating childbirth: Principal | ICD-10-CM | POA: Diagnosis present

## 2013-12-13 DIAGNOSIS — Z3A39 39 weeks gestation of pregnancy: Secondary | ICD-10-CM | POA: Diagnosis present

## 2013-12-13 DIAGNOSIS — Z3493 Encounter for supervision of normal pregnancy, unspecified, third trimester: Secondary | ICD-10-CM

## 2013-12-13 DIAGNOSIS — Z3492 Encounter for supervision of normal pregnancy, unspecified, second trimester: Secondary | ICD-10-CM

## 2013-12-13 DIAGNOSIS — O0933 Supervision of pregnancy with insufficient antenatal care, third trimester: Secondary | ICD-10-CM

## 2013-12-13 LAB — CBC
HEMATOCRIT: 27.6 % — AB (ref 36.0–46.0)
Hemoglobin: 9.4 g/dL — ABNORMAL LOW (ref 12.0–15.0)
MCH: 30.7 pg (ref 26.0–34.0)
MCHC: 34.1 g/dL (ref 30.0–36.0)
MCV: 90.2 fL (ref 78.0–100.0)
Platelets: 200 10*3/uL (ref 150–400)
RBC: 3.06 MIL/uL — ABNORMAL LOW (ref 3.87–5.11)
RDW: 13.6 % (ref 11.5–15.5)
WBC: 10.3 10*3/uL (ref 4.0–10.5)

## 2013-12-13 LAB — TYPE AND SCREEN
ABO/RH(D): O POS
Antibody Screen: NEGATIVE

## 2013-12-13 MED ORDER — LIDOCAINE HCL (PF) 1 % IJ SOLN
30.0000 mL | INTRAMUSCULAR | Status: DC | PRN
  Filled 2013-12-13: qty 30

## 2013-12-13 MED ORDER — SODIUM CHLORIDE 0.9 % IV BOLUS (SEPSIS)
1000.0000 mL | Freq: Once | INTRAVENOUS | Status: AC
  Administered 2013-12-13: 1000 mL via INTRAVENOUS

## 2013-12-13 MED ORDER — OXYCODONE-ACETAMINOPHEN 5-325 MG PO TABS: 1.0000 | ORAL_TABLET | ORAL | Status: DC | PRN

## 2013-12-13 MED ORDER — PENICILLIN G POTASSIUM 5000000 UNITS IJ SOLR
2.5000 10*6.[IU] | INTRAVENOUS | Status: DC
  Filled 2013-12-13 (×2): qty 2.5

## 2013-12-13 MED ORDER — OXYCODONE-ACETAMINOPHEN 5-325 MG PO TABS: 2.0000 | ORAL_TABLET | ORAL | Status: DC | PRN

## 2013-12-13 MED ORDER — OXYTOCIN BOLUS FROM INFUSION
500.0000 mL | INTRAVENOUS | Status: DC
  Administered 2013-12-13: 500 mL via INTRAVENOUS

## 2013-12-13 MED ORDER — ACETAMINOPHEN 325 MG PO TABS: 650.0000 mg | ORAL_TABLET | ORAL | Status: DC | PRN

## 2013-12-13 MED ORDER — CITRIC ACID-SODIUM CITRATE 334-500 MG/5ML PO SOLN: 30.0000 mL | ORAL | Status: DC | PRN

## 2013-12-13 MED ORDER — ONDANSETRON HCL 4 MG/2ML IJ SOLN
4.0000 mg | Freq: Four times a day (QID) | INTRAMUSCULAR | Status: DC | PRN
Start: 2013-12-13 — End: 2013-12-14

## 2013-12-13 MED ORDER — IBUPROFEN 600 MG PO TABS
600.0000 mg | ORAL_TABLET | Freq: Four times a day (QID) | ORAL | Status: DC
  Administered 2013-12-13 – 2013-12-15 (×7): 600 mg via ORAL
  Filled 2013-12-13 (×7): qty 1

## 2013-12-13 MED ORDER — FENTANYL CITRATE 0.05 MG/ML IJ SOLN
100.0000 ug | INTRAMUSCULAR | Status: DC | PRN
  Administered 2013-12-13 (×2): 100 ug via INTRAVENOUS
  Filled 2013-12-13 (×2): qty 2

## 2013-12-13 MED ORDER — LACTATED RINGERS IV SOLN: 500.0000 mL | INTRAVENOUS | Status: DC | PRN

## 2013-12-13 MED ORDER — OXYTOCIN 40 UNITS IN LACTATED RINGERS INFUSION - SIMPLE MED
62.5000 mL/h | INTRAVENOUS | Status: DC
  Filled 2013-12-13: qty 1000

## 2013-12-13 MED ORDER — LACTATED RINGERS IV SOLN
INTRAVENOUS | Status: DC
  Administered 2013-12-13: 19:00:00 via INTRAVENOUS

## 2013-12-13 MED ORDER — PENICILLIN G POTASSIUM 5000000 UNITS IJ SOLR
5.0000 10*6.[IU] | Freq: Once | INTRAVENOUS | Status: AC
  Administered 2013-12-13: 5 10*6.[IU] via INTRAVENOUS
  Filled 2013-12-13: qty 5

## 2013-12-13 NOTE — Progress Notes (Signed)
Spoke with Dr. Debroah LoopArnold. Pt is a G3P2 at 39 weeks with c/o decreased fetal movement. FHR is a category 1 tracing, pt is contracting every 2-3-4 min, rates them a 6 on her pain scale. Her cervix is 3-4cm, 70-80%, 0 station.  STates [facility].

## 2013-12-13 NOTE — ED Notes (Signed)
Pt placed on OB monitor with recording strip for OB RN to review on arrival.

## 2013-12-13 NOTE — Progress Notes (Signed)
[facility], charge nurse,[facility], [facility].

## 2013-12-13 NOTE — ED Notes (Signed)
Pt reports this is her 3rd pregnancy and child. Reports she is 39 weeks and last felt the baby move yesterday evening. Denies any pain or bleeding. [facility]

## 2013-12-13 NOTE — ED Provider Notes (Signed)
CSN: [id number]     Arrival date & time 12/13/13  1557 History   First MD Initiated Contact with Patient 12/13/13 1616     Chief Complaint  Patient presents with  . Emesis During Pregnancy    Patient is a 23 y.o. female presenting with general illness. The history is provided by the patient.  Illness Location:  Abdomen Quality:  Pressure, Decreased Fetal Movement Severity:  Severe Onset quality:  Gradual Duration:  1 day Timing:  Intermittent Progression:  Waxing and waning Chronicity:  New Context:  1667w0d pregnant with 3rd pregnancy Relieved by:  Nothing Worsened by:  Nothing Ineffective treatments:  Nothing  Associated symptoms: abdominal pain   Associated symptoms: no chest pain   Associated symptoms comment:  No leakage of fluid, no vaginal bleeding Abdominal pain:    Location:  Suprapubic   Quality:  Pressure   Severity:  Moderate   Onset quality:  Gradual   Duration:  1 day   Timing:  Intermittent   Progression:  Waxing and waning   Past Medical History  Diagnosis Date  . Medical history non-contributory    Past Surgical History  Procedure Laterality Date  . No past surgeries     Family History  Problem Relation Age of Onset  . Diabetes Mother    History  Substance Use Topics  . Smoking status: Never Smoker   . Smokeless tobacco: Never Used  . Alcohol Use: No   OB History    Gravida Para Term Preterm AB TAB SAB Ectopic Multiple Living   3 2 2       2      Review of Systems  Cardiovascular: Negative for chest pain.  Gastrointestinal: Positive for abdominal pain.  All other systems reviewed and are negative.   Allergies  Review of patient's allergies indicates no known allergies.  Home Medications   Prior to Admission medications   Medication Sig Start Date End Date Taking? Authorizing Provider  acetaminophen (TYLENOL) 500 MG tablet Take 500 mg by mouth every 6 (six) hours as needed.    Historical Provider, MD  IRON PO Take by mouth.     Historical Provider, MD  Multiple Vitamin (MULTIVITAMIN WITH MINERALS) TABS Take 1 tablet by mouth daily.    Historical Provider, MD  Prenatal Vit-Fe Fumarate-FA (PRENATAL MULTIVITAMIN) TABS tablet Take 1 tablet by mouth daily at 12 noon.    Historical Provider, MD  ranitidine (ZANTAC) 150 MG tablet Take 1 tablet (150 mg total) by mouth 2 (two) times daily. Patient not taking: Reported on 12/09/2013 10/24/13   Rhona RaiderJacob J Stinson, DO   BP 91/58 mmHg  Pulse 85  Temp(Src) 97.9 F (36.6 C) (Oral)  Resp 18  SpO2 100%  LMP 03/15/2013   Physical Exam  Constitutional: She is oriented to person, place, and time. She appears well-developed and well-nourished. She is cooperative. No distress.  HENT:  Head: Normocephalic and atraumatic.  Right Ear: External ear normal.  Left Ear: External ear normal.  Neck: Normal range of motion and phonation normal.  Cardiovascular: Normal rate and regular rhythm.   Pulmonary/Chest: Effort normal and breath sounds normal. No respiratory distress. She has no wheezes. She has no rales.  Abdominal:  Gravid  Neurological: She is alert and oriented to person, place, and time.  Reflex Scores:      Patellar reflexes are 2+ on the right side and 2+ on the left side. Skin: Skin is warm and dry. No rash noted. She is not diaphoretic.  ED Course  Procedures   Fetal tracing with accels, no decels. Toco with irritability and contractions sporadically.   MDM   Final diagnoses:  Indication for care in labor or delivery  Pregnant and not yet delivered, third trimester   23 y.o. [id number] female at 393w0d presents with decreased fetal movement and vaginal pressure.   Well appearing on exam, no distress.   Per OB nurse - Dilated to 4cm, 80% effaced, 0 station.   Concern for early labor, no complicating factors, fetal strip reasuring. [facility]   This case managed in conjunction with my attending, Dr. Anitra LauthPlunkett.     Maxine GlennAnn Dhanya Bogle,  MD 12/13/13 [id number]  Gwyneth SproutWhitney Plunkett, MD 12/14/13 [id number]

## 2013-12-13 NOTE — ED Notes (Signed)
Ob rapid response nurse called

## 2013-12-13 NOTE — Progress Notes (Signed)
[facility]

## 2013-12-13 NOTE — ED Notes (Signed)
Pt reports seeing her mucus plug around a week ago with perineum pressure starting a few days, worsening when she walks.  Pt was 2 cm dilated at last OB appointment.

## 2013-12-13 NOTE — ED Notes (Signed)
Corrie DandyMary, RN OB rapid response at bedside.

## 2013-12-13 NOTE — H&P (Signed)
Heather Obrien is a 23 y.o. female 243P2002 with IUP at 7242w0d presenting for active labor. Pt states she has been having regular contractions, associated with none vaginal bleeding.  Membranes are intact, with active fetal movement.   [facility]  Prenatal History/Complications:  [facility]  Dating  LMP consistent with 2nd trimester ultrasound  Genetic Screen  Quad: too late                  NIPS:  Anatomic US  Normal  GTT Early:               Third trimester: 5056  TDaP vaccine 10/24/13  Flu vaccine 10/24/13  GBS   Contraception  depo-provera  Baby Food  bottle  Circumcision   Pediatrician   Support Person     Past Medical History: Past Medical History  Diagnosis Date  . Medical history non-contributory     Past Surgical History: Past Surgical History  Procedure Laterality Date  . No past surgeries      Obstetrical History: OB History    Gravida Para Term Preterm AB TAB SAB Ectopic Multiple Living   3 2 2       2      Social History: History   Social History  . Marital Status: Single    Spouse Name: N/A    Number of Children: N/A  . Years of Education: N/A   Social History Main Topics  . Smoking status: Never Smoker   . Smokeless tobacco: Never Used  . Alcohol Use: No  . Drug Use: Yes    Special: Marijuana     Comment: Marijuana use 4 months ago  . Sexual Activity: Yes   Other Topics Concern  . None   Social History Narrative    Family History: Family History  Problem Relation Age of Onset  . Diabetes Mother     Allergies: No Known Allergies  Prescriptions prior to admission  Medication Sig Dispense Refill Last Dose  . acetaminophen (TYLENOL) 500 MG tablet Take 500 mg by mouth every 6 (six) hours as needed for mild pain.    12/13/2013 at Unknown time  . IRON PO Take 1 tablet by mouth daily.    12/13/2013 at Unknown time  . Prenatal Vit-Fe Fumarate-FA (PRENATAL MULTIVITAMIN) TABS tablet Take 1 tablet by mouth daily at 12 noon.    12/12/2013 at Unknown time  . ranitidine (ZANTAC) 150 MG tablet Take 1 tablet (150 mg total) by mouth 2 (two) times daily. (Patient not taking: Reported on 12/13/2013) 90 tablet 1 10/30/2013 at Unknown time     Review of Systems   Constitutional: No fevers or chills  Blood pressure 118/69, pulse 67, temperature 97.9 F (36.6 C), temperature source Oral, resp. rate 20, last menstrual period 03/15/2013, SpO2 100 %, unknown if currently breastfeeding. General appearance: alert and cooperative Lungs: clear to auscultation bilaterally Heart: regular rate and rhythm Abdomen: soft, non-tender; bowel sounds normal Extremities: Homans sign is negative, no sign of DVT DTR's wnl Presentation: cephalic Fetal monitoringBaseline: 115 bpm, Variability: Good {> 6 bpm), Accelerations: Reactive and Decelerations: Absent Uterine activityFrequency: Every 2-4 minutes Dilation: 6.5 Effacement (%): 80 Station: 0 Exam by:: Donavan FoilBass RN   Prenatal labs: ABO, Rh: O/POS/-- (08/13 1403) Antibody: NEG (08/13 1403) Rubella:   RPR: NON REAC (08/13 1403)  HBsAg: NEGATIVE (08/13 1403)  HIV: NONREACTIVE (08/13 1403)  GBS: Positive (08/13 0000)  1 hr Glucola 56 Genetic screening  Too late Anatomy [place]  wnl   Prenatal Transfer Tool  Maternal Diabetes: No Genetic Screening: Presented too late Maternal Ultrasounds/Referrals: Normal Fetal Ultrasounds or other Referrals:  None Maternal Substance Abuse:  No Significant Maternal Medications:  None Significant Maternal Lab Results: Lab values include: Group B Strep positive     Results for orders placed or performed during the hospital encounter of 12/13/13 (from the past 24 hour(s))  CBC   Collection Time: 12/13/13  7:50 PM  Result Value Ref Range   WBC 10.3 4.0 - 10.5 K/uL   RBC 3.06 (L) 3.87 - 5.11 MIL/uL   Hemoglobin 9.4 (L) 12.0 - 15.0 g/dL   HCT 13.227.6 (L) 44.036.0 - 10.246.0 %   MCV 90.2 78.0 - 100.0 fL   MCH 30.7 26.0 - 34.0 pg   MCHC 34.1 30.0 - 36.0 g/dL    RDW 72.513.6 36.611.5 - 44.015.5 %   Platelets 200 150 - 400 K/uL    Assessment: Heather Obrien is a 23 y.o. G3P2002 at 7649w0d here with active labor #Labor:SOL, expectant management #Pain: Analgesia and anesthesia prn #FWB: Category1 #ID:  GBS positive, PCN prophylaxis #MOF: breast #MOC:nexplanon vs depo #Circ:  N/a female baby  Jacquiline Doearker, Raymie Giammarco 12/13/2013, 8:26 PM

## 2013-12-13 NOTE — Progress Notes (Signed)
Pt c/o not feeling her baby move and feeling pressure off and on during the day. Denies vaginal bleeding or leaking of fluid. Says she has had all vaginal deliveries and delivered her other babies at term. Pt is a G3P2 at 39 weeks. [facility] because she was not feeling her baby move. She had an non-reactive NST. An ultrasound was done  And she was sent home.

## 2013-12-13 NOTE — Progress Notes (Signed)
Dr. Anitra LauthPlunkett in to see pt. I will check the pt's cervix and call the OB to see if we will tx the pt to [facility].

## 2013-12-14 LAB — RPR

## 2013-12-14 LAB — HIV ANTIBODY (ROUTINE TESTING W REFLEX): HIV 1&2 Ab, 4th Generation: NONREACTIVE

## 2013-12-14 MED ORDER — SENNOSIDES-DOCUSATE SODIUM 8.6-50 MG PO TABS
2.0000 | ORAL_TABLET | ORAL | Status: DC
  Administered 2013-12-14 – 2013-12-15 (×2): 2 via ORAL
  Filled 2013-12-14 (×2): qty 2

## 2013-12-14 MED ORDER — PRENATAL MULTIVITAMIN CH
1.0000 | ORAL_TABLET | Freq: Every day | ORAL | Status: DC
  Administered 2013-12-14 – 2013-12-15 (×2): 1 via ORAL
  Filled 2013-12-14 (×2): qty 1

## 2013-12-14 MED ORDER — DIBUCAINE 1 % RE OINT: 1.0000 "application " | TOPICAL_OINTMENT | RECTAL | Status: DC | PRN

## 2013-12-14 MED ORDER — SIMETHICONE 80 MG PO CHEW: 80.0000 mg | CHEWABLE_TABLET | ORAL | Status: DC | PRN

## 2013-12-14 MED ORDER — ZOLPIDEM TARTRATE 5 MG PO TABS: 5.0000 mg | ORAL_TABLET | Freq: Every evening | ORAL | Status: DC | PRN

## 2013-12-14 MED ORDER — DIPHENHYDRAMINE HCL 25 MG PO CAPS: 25.0000 mg | ORAL_CAPSULE | Freq: Four times a day (QID) | ORAL | Status: DC | PRN

## 2013-12-14 MED ORDER — ONDANSETRON HCL 4 MG PO TABS: 4.0000 mg | ORAL_TABLET | ORAL | Status: DC | PRN

## 2013-12-14 MED ORDER — TETANUS-DIPHTH-ACELL PERTUSSIS 5-2.5-18.5 LF-MCG/0.5 IM SUSP: 0.5000 mL | Freq: Once | INTRAMUSCULAR | Status: DC

## 2013-12-14 MED ORDER — BENZOCAINE-MENTHOL 20-0.5 % EX AERO: 1.0000 "application " | INHALATION_SPRAY | CUTANEOUS | Status: DC | PRN

## 2013-12-14 MED ORDER — OXYCODONE-ACETAMINOPHEN 5-325 MG PO TABS: 2.0000 | ORAL_TABLET | ORAL | Status: DC | PRN

## 2013-12-14 MED ORDER — OXYCODONE-ACETAMINOPHEN 5-325 MG PO TABS
1.0000 | ORAL_TABLET | ORAL | Status: DC | PRN
  Administered 2013-12-15: 1 via ORAL
  Filled 2013-12-14: qty 1

## 2013-12-14 MED ORDER — LANOLIN HYDROUS EX OINT: TOPICAL_OINTMENT | CUTANEOUS | Status: DC | PRN

## 2013-12-14 MED ORDER — ONDANSETRON HCL 4 MG/2ML IJ SOLN: 4.0000 mg | INTRAMUSCULAR | Status: DC | PRN

## 2013-12-14 MED ORDER — WITCH HAZEL-GLYCERIN EX PADS: 1.0000 "application " | MEDICATED_PAD | CUTANEOUS | Status: DC | PRN

## 2013-12-14 NOTE — Plan of Care (Signed)
Problem: Phase II Progression Outcomes Goal: Tolerating diet Outcome: Completed/Met Date Met:  12/14/13 Goal: Other Phase II Outcomes/Goals Outcome: Not Applicable Date Met:  31/43/88

## 2013-12-14 NOTE — Progress Notes (Signed)
Post Partum Day 1 Subjective:  Heather Obrien is a 23 y.o. G3P3003 2475w0d s/p NSVD.  No acute events overnight.  Pt denies problems with ambulating, voiding or po intake.  She denies nausea or vomiting.  Pain is well controlled.  She has had flatus. She has had bowel movement.  Lochia Small.  Plan for birth control is Depo-Provera, or nexplanon.  Method of Feeding: Breast  Objective: Blood pressure 103/57, pulse 57, temperature 97.6 F (36.4 C), temperature source Oral, resp. rate 20, last menstrual period 03/15/2013, SpO2 100 %, unknown if currently breastfeeding.  Physical Exam:  General: alert, cooperative and no distress Lochia:normal flow Chest: CTAB Heart: RRR no m/r/g Abdomen: +BS, soft, nontender,  Uterine Fundus: firm, below umbilicus DVT Evaluation: No evidence of DVT seen on physical exam. Extremities: No edema   Recent Labs  12/13/13 1950  HGB 9.4*  HCT 27.6*    Assessment/Plan:  ASSESSMENT: Heather Obrien is a 23 y.o. [id number] 1575w0d s/p NSVD  Plan for discharge tomorrow   LOS: 1 day   Jacquiline Doearker, Caleb 12/14/2013, 7:58 AM

## 2013-12-14 NOTE — Plan of Care (Signed)
Problem: Discharge Progression Outcomes Goal: Tolerating diet Outcome: Completed/Met Date Met:  12/14/13     

## 2013-12-14 NOTE — Lactation Note (Signed)
This note was copied from the chart of Heather Shani Falk. Lactation Consultation Note  Patient Name: Heather Obrien GUYQI'HToday's Date: 12/14/2013 Reason for consult: Initial assessment Mom is breast/bottle feeding by choice. Basic teaching reviewed with Mom. Encouraged to BF with each feeding before giving any bottles. Supplemental guidelines reviewed with Mom. Baby asleep at this visit, Mom reports recently fed. Mom requested hand pump, set up/cleaning of hand pump reviewed. Lactation brochure left for review, advised of OP services and support group. Encouraged Mom to call if she would like assist with latch or for questions/concerns.   Maternal Data Has patient been taught Hand Expression?: No (Mom declined LC demonstration, [facility] Does the patient have breastfeeding experience prior to this delivery?: Yes  Feeding Nipple Type: Slow - flow Length of feed: 10 min  LATCH Score/Interventions                      Lactation Tools Discussed/Used Tools: Pump Breast pump type: Manual   Consult Status Consult Status: Follow-up Date: 12/15/13 Follow-up type: In-patient    Heather Obrien, Heather Obrien 12/14/2013, 7:34 PM

## 2013-12-14 NOTE — Plan of Care (Signed)
Problem: Phase I Progression Outcomes Goal: Pain controlled with appropriate interventions Outcome: Completed/Met Date Met:  12/14/13 Goal: Voiding adequately Outcome: Completed/Met Date Met:  12/14/13 Goal: OOB as tolerated unless otherwise ordered Outcome: Completed/Met Date Met:  12/14/13 Goal: VS, stable, temp < 100.4 degrees F Outcome: Completed/Met Date Met:  12/14/13 Goal: Initial discharge plan identified Outcome: Completed/Met Date Met:  12/14/13 Goal: Other Phase I Outcomes/Goals Outcome: Completed/Met Date Met:  12/14/13

## 2013-12-14 NOTE — Plan of Care (Signed)
Problem: Phase II Progression Outcomes Goal: Progress activity as tolerated unless otherwise ordered Outcome: Completed/Met Date Met:  12/14/13 Goal: Afebrile, VS remain stable Outcome: Completed/Met Date Met:  12/14/13

## 2013-12-14 NOTE — Plan of Care (Signed)
Problem: Phase I Progression Outcomes Goal: Foley catheter patent Outcome: Not Applicable Date Met:  96/94/09 Goal: IS, TCDB as ordered Outcome: Not Applicable Date Met:  82/86/75  Problem: Phase II Progression Outcomes Goal: Incision intact & without signs/symptoms of infection Outcome: Not Applicable Date Met:  19/82/42 Goal: Rh isoimmunization per orders Outcome: Not Applicable Date Met:  99/80/69  Problem: Discharge Progression Outcomes Goal: Remove staples per MD order Outcome: Not Applicable Date Met:  99/67/22 Goal: MMR given as ordered Outcome: Not Applicable Date Met:  77/37/50

## 2013-12-15 ENCOUNTER — Encounter: Payer: Self-pay | Admitting: Family Medicine

## 2013-12-15 ENCOUNTER — Encounter: Payer: Medicaid Other | Admitting: Family Medicine

## 2013-12-15 MED ORDER — IBUPROFEN 600 MG PO TABS: 600.0000 mg | ORAL_TABLET | ORAL | Status: AC | PRN

## 2013-12-15 NOTE — Progress Notes (Signed)
UR chart review completed.  

## 2013-12-15 NOTE — Discharge Instructions (Signed)
Postpartum Care After Vaginal Delivery °After you deliver your newborn (postpartum period), the usual stay in the hospital is 24-72 hours. If there were problems with your labor or delivery, or if you have other medical problems, you might be in the hospital longer.  °While you are in the hospital, you will receive help and instructions on how to care for yourself and your newborn during the postpartum period.  °While you are in the hospital: °· Be sure to tell your nurses if you have pain or discomfort, as well as where you feel the pain and what makes the pain worse. °· If you had an incision made near your vagina (episiotomy) or if you had some tearing during delivery, the nurses may put ice packs on your episiotomy or tear. The ice packs may help to reduce the pain and swelling. °· If you are breastfeeding, you may feel uncomfortable contractions of your uterus for a couple of weeks. This is normal. The contractions help your uterus get back to normal size. °· It is normal to have some bleeding after delivery. °¨ For the first 1-3 days after delivery, the flow is red and the amount may be similar to a period. °¨ It is common for the flow to start and stop. °¨ In the first few days, you may pass some small clots. Let your nurses know if you begin to pass large clots or your flow increases. °¨ Do not  flush blood clots down the toilet before having the nurse look at them. °¨ During the next 3-10 days after delivery, your flow should become more watery and pink or brown-tinged in color. °¨ Ten to fourteen days after delivery, your flow should be a small amount of yellowish-white discharge. °¨ The amount of your flow will decrease over the first few weeks after delivery. Your flow may stop in 6-8 weeks. Most women have had their flow stop by 12 weeks after delivery. °· You should change your sanitary pads frequently. °· Wash your hands thoroughly with soap and water for at least 20 seconds after changing pads, using  the toilet, or before holding or feeding your newborn. °· You should feel like you need to empty your bladder within the first 6-8 hours after delivery. °· In case you become weak, lightheaded, or faint, call your nurse before you get out of bed for the first time and before you take a shower for the first time. °· Within the first few days after delivery, your breasts may begin to feel tender and full. This is called engorgement. Breast tenderness usually goes away within 48-72 hours after engorgement occurs. You may also notice milk leaking from your breasts. If you are not breastfeeding, do not stimulate your breasts. Breast stimulation can make your breasts produce more milk. °· Spending as much time as possible with your newborn is very important. During this time, you and your newborn can feel close and get to know each other. Having your newborn stay in your room (rooming in) will help to strengthen the bond with your newborn.  It will give you time to get to know your newborn and become comfortable caring for your newborn. °· Your hormones change after delivery. Sometimes the hormone changes can temporarily cause you to feel sad or tearful. These feelings should not last more than a few days. If these feelings last longer than that, you should talk to your caregiver. °· If desired, talk to your caregiver about methods of family planning or contraception. °·   Talk to your caregiver about immunizations. Your caregiver may want you to have the following immunizations before leaving the hospital: °¨ Tetanus, diphtheria, and pertussis (Tdap) or tetanus and diphtheria (Td) immunization. It is very important that you and your family (including grandparents) or others caring for your newborn are up-to-date with the Tdap or Td immunizations. The Tdap or Td immunization can help protect your newborn from getting ill. °¨ Rubella immunization. °¨ Varicella (chickenpox) immunization. °¨ Influenza immunization. You should  receive this annual immunization if you did not receive the immunization during your pregnancy. °Document Released: 11/13/2006 Document Revised: 10/11/2011 Document Reviewed: 09/13/2011 °ExitCare® Patient Information ©2015 [employer]. This information is not intended to replace advice given to you by your health care provider. Make sure you discuss any questions you have with your health care provider. ° °Breastfeeding Challenges and Solutions °Even though breastfeeding is natural, it can be challenging, especially in the first few weeks after childbirth. It is normal for problems to arise when starting to breastfeed your new baby, even if you have breastfed before. This document provides some solutions to the most common breastfeeding challenges.  °CHALLENGES AND SOLUTIONS °Challenge--Cracked or Sore Nipples °Cracked or sore nipples are commonly experienced by breastfeeding mothers. Cracked or sore nipples often are caused by inadequate latching (when your baby's mouth attaches to your breast to breastfeed). Soreness can also happen if your baby is not positioned properly at your breast. Although nipple cracking and soreness are common during the first week after birth, nipple pain is never normal. If you experience nipple cracking or soreness that lasts longer than 1 week or nipple pain, call your health care provider or lactation consultant.  °Solution °Ensure proper latching and positioning of your baby by following the steps below: °· Find a comfortable place to sit or lie down, with your neck and back well supported. °· Place a pillow or rolled up blanket under your baby to bring him or her to the level of your breast (if you are seated). °· Make sure that your baby's abdomen is facing your abdomen. °· Gently massage your breast. With your fingertips, massage from your chest wall toward your nipple in a circular motion. This encourages milk flow. You may need to continue this action during the feeding if  your milk flows slowly. °· Support your breast with 4 fingers underneath and your thumb above your nipple. Make sure your fingers are well away from your nipple and your baby's mouth. °· Stroke your baby's lips gently with your finger or nipple. °· When your baby's mouth is open wide enough, quickly bring your baby to your breast, placing your entire nipple and as much of the colored area around your nipple (areola) as possible into your baby's mouth. °¨ More areola should be visible above your baby's upper lip than below the lower lip. °¨ Your baby's tongue should be between his or her lower gum and your breast. °· Ensure that your baby's mouth is correctly positioned around your nipple (latched). Your baby's lips should create a seal on your breast and be turned out (everted). °· It is common for your baby to suck for about 2-3 minutes in order to start the flow of breast milk. °Signs that your baby has successfully latched on to your nipple include:  °· Quietly tugging or quietly sucking without causing you pain.   °· Swallowing heard between every 3-4 sucks.   °· Muscle movement above and in front of his or her ears with sucking.   °  Signs that your baby has not successfully latched on to nipple include:  °· Sucking sounds or smacking sounds from your baby while nursing.   °· Nipple pain.   °Ensure that your breasts stay moisturized and healthy by: °· Avoiding the use of soap on your nipples.   °· Wearing a supportive bra. Avoid wearing underwire-style bras or tight bras.   °· Air drying your nipples for 3-4 minutes after each feeding.   °· Using only cotton bra pads to absorb breast milk leakage. Leaking of breast milk between feedings is normal.  Be sure to change the pads if they become soaked with milk. °· Using lanolin on your nipples after nursing. Lanolin helps to maintain your skin's normal moisture barrier. If you use pure lanolin you do not need to wash it off before feeding your baby again. Pure  lanolin is not toxic to your baby.  You may also hand express a few drops of breast milk and gently massage that milk into your nipples, allowing it to air dry. °Challenge--Breast Engorgement °Breast engorgement is the overfilling of your breasts with breast milk. In the first few weeks after giving birth, you may experience breast engorgement. Breast engorgement can make your breasts throb and feel hard, tightly stretched, warm, and tender. Engorgement peaks about the fifth day after you give birth. Having breast engorgement does not mean you have to stop breastfeeding your baby. °Solution °· Breastfeed when you feel the need to reduce the fullness of your breasts or when your baby shows signs of hunger. This is called "breastfeeding on demand." °· Newborns (babies younger than 4 weeks) often breastfeed every 1-3 hours during the day. You may need to awaken your baby to feed if he or she is asleep at a feeding time. °· Do not allow your baby to sleep longer than 5 hours during the night without a feeding. °· Pump or hand express breast milk before breastfeeding to soften your breast, areola, and nipple. °· Apply warm, moist heat (in the shower or with warm water-soaked hand towels) just before feeding or pumping, or massage your breast before or during breastfeeding. This increases circulation and helps your milk to flow. °· Completely empty your breasts when breastfeeding or pumping. Afterward, wear a snug bra (nursing or regular) or tank top for 1-2 days to signal your body to slightly decrease milk production. Only wear snug bras or tank tops to treat engorgement. Tight bras typically should be avoided by breastfeeding mothers. Once engorgement is relieved, return to wearing regular, loose-fitting clothes. °· Apply ice packs to your breasts to lessen the pain from engorgement and relieve swelling, unless the ice is uncomfortable for you. °· Do not delay feedings. Try to relax when it is time to feed your baby.  This helps to trigger your "let-down reflex," which releases milk from your breast. °· Ensure your baby is latched on to your breast and positioned properly while breastfeeding. °· Allow your baby to remain at your breast as long as he or she is latched on well and actively sucking. Your baby will let you know when he or she is done breastfeeding by pulling away from your breast or falling asleep. °· Avoid introducing bottles or pacifiers to your baby in the early weeks of breastfeeding. Wait to introduce these things until after resolving any breastfeeding challenges. °· Try to pump your milk on the same schedule as when your baby would breastfeed if you are returning to work or away from home for an extended period. °· Drink   plenty of fluids to avoid dehydration, which can eventually put you at greater risk of breast engorgement. °If you follow these suggestions, your engorgement should improve in 24-48 hours. If you are still experiencing difficulty, call your lactation consultant or health care provider.  °Challenge--Plugged Milk Ducts °Plugged milk ducts occur when the duct does not drain milk effectively and becomes swollen. Wearing a tight-fitting nursing bra or having difficulty with latching may cause plugged milk ducts. Not drinking enough water (8-10 c [1.9-2.4 L] per day) can contribute to plugged milk ducts. Once a duct has become plugged, hard lumps, soreness, and redness may develop in your breast.  °Solution °Do not delay feedings. Feed your baby frequently and try to empty your breasts of milk at each feeding. Try breastfeeding from the affected side first so there is a better chance that the milk will drain completely from that breast. Apply warm, moist towels to your breasts for 5-10 minutes before feeding. Alternatively, a hot shower right before breastfeeding can provide the moist heat that can encourage milk flow. Gentle massage of the sore area before and during a feeding may also help. Avoid  wearing tight clothing or bras that put pressure on your breasts. Wear bras that offer good support to your breasts, but avoid underwire bras. If you have a plugged milk duct and develop a fever, you need to see your health care provider.  °Challenge--Mastitis °Mastitis is inflammation of your breast. It usually is caused by a bacterial infection and can cause flu-like symptoms. You may develop redness in your breast and a fever. Often when mastitis occurs, your breast becomes firm, warm, and very painful. The most common causes of mastitis are poor latching, ineffective sucking from your baby, consistent pressure on your breast (possibly from wearing a tight-fitting bra or shirt that restricts the milk flow), unusual stress or fatigue, or missed feedings.  °Solution °You will be given antibiotic medicine to treat the infection. It is still important to breastfeed frequently to empty your breasts. Continuing to breastfeed while you recover from mastitis will not harm your baby. Make sure your baby is positioned properly during every feeding. Apply moist heat to your breasts for a few minutes before feeding to help the milk flow and to help your breasts empty more easily. °Challenge--Thrush °Thrush is a yeast infection that can form on your nipples, in your breast, or in your baby's mouth. It causes itching, soreness, burning or stabbing pain, and sometimes a rash.  °Solution °You will be given a medicated ointment for your nipples, and your baby will be given a liquid medicine for his or her mouth. It is important that you and your baby are treated at the same time because thrush can be passed between you and your baby. Change disposable nursing pads often. Any bras, towels, or clothing that come in contact with infected areas of your body or your baby's body need to be washed in very hot water every day. Wash your hands and your baby's hands often. All pacifiers, bottle nipples, or toys your baby puts in his or her  mouth should be boiled once a day for 20 minutes. After 1 week of treatment, discard pacifiers and bottle nipples and buy new ones. All breast pump parts that touch the milk need to be boiled for 20 minutes every day. °Challenge--Low Milk Supply °You may not be producing enough milk if your baby is not gaining the proper amount of weight. Breast milk production is based on a   supply-and-demand system. Your milk supply depends on how frequently and effectively your baby empties your breast.  °Solution °The more you breastfeed and pump, the more breast milk you will produce. It is important that your baby empties at least one of your breasts at each feeding. If this is not happening, then use a breast pump or hand express any milk that remains. This will help to drain as much milk as possible at each feeding. It will also signal your body to produce more milk. If your baby is not emptying your breasts, it may be due to latching, sucking, or positioning problems. If low milk supply continues after addressing these issues, contact your health care provider or a lactation specialist as soon as possible. °Challenge--Inverted or Flat Nipples °Some women have nipples that turn inward instead of protruding outward. Other women have nipples that are flat. Inverted or flat nipples can sometimes make it more difficult for your baby to latch onto your breast. °Solution °You may be given a small device that pulls out inverted nipples. This device should be applied right before your baby is brought to your breast. You can also try using a breast pump for a short time before placing the baby at your breast. The pump can pull your nipple outwards to help your infant latch more easily. The baby's sucking motion will help the inverted nipple protrude as well.  °If you have flat nipples, encourage your baby to latch onto your breast and feed frequently in the early days after birth. This will give your baby practice latching on  correctly while your breast is still soft. When your milk supply increases, between the second and fifth day after birth and your breasts become full, your baby will have an easier time latching.  °Contact a lactation consultant if you still have concerns. She or he can teach you additional techniques to address breastfeeding problems related to nipple shape and position.  °FOR MORE INFORMATION °[employer]: www.llli.org °Document Released: 07/10/2005 Document Revised: 01/21/2013 Document Reviewed: 07/12/2012 °[employer]® Patient Information ©2015 [employer]. This information is not intended to replace advice given to you by your health care provider. Make sure you discuss any questions you have with your health care provider. ° °

## 2013-12-15 NOTE — Discharge Summary (Signed)
Obstetric Discharge Summary Reason for Admission: onset of labor Prenatal Procedures: ultrasound Intrapartum Procedures: spontaneous vaginal delivery, GBS prophylaxis and < 4 hours before delivery Postpartum Procedures: none Complications-Operative and Postpartum: none HEMOGLOBIN  Date Value Ref Range Status  12/13/2013 9.4* 12.0 - 15.0 g/dL Final   HCT  Date Value Ref Range Status  12/13/2013 27.6* 36.0 - 46.0 % Final    Physical Exam:  General: alert, cooperative, appears stated age and no distress Lochia: appropriate Uterine Fundus: firm Incision: NA DVT Evaluation: Negative Homan's sign.  Discharge Diagnoses: Term Pregnancy-delivered and anemia  Discharge Information: Date: 12/15/2013 Activity: pelvic rest Diet: routine and increase dietary iron Medications: PNV, Ibuprofen and Iron Condition: stable Instructions: refer to practice specific booklet Discharge to: home Follow-up Information    [facility]   Specialty:  Obstetrics and Gynecology   Contact information:   [address] [place] [id number] [phone]      [facility].   Why:  As needed in emergencies   Contact information:   [id number] [place] [id number] [phone]      Newborn Data: Live born female  Birth Weight: 7 lb 1.2 oz (3210 g) APGAR: 9, 9  Home with mother.  Heather Obrien 12/15/2013, 10:03 AM

## 2013-12-15 NOTE — Plan of Care (Signed)
Problem: Consults Goal: Postpartum Patient Education (See Patient Education module for education specifics.)  Outcome: Completed/Met Date Met:  12/15/13 Goal: Skin Care Protocol Initiated - if Braden Score 18 or less If consults are not indicated, leave blank or document N/A  Outcome: Not Applicable Date Met:  02/21/77 Goal: Nutrition Consult-if indicated Outcome: Not Applicable Date Met:  90/94/00 Goal: Diabetes Guidelines if Diabetic/Glucose > 140 If diabetic or lab glucose is > 140 mg/dl - Initiate Diabetes/Hyperglycemia Guidelines & Document Interventions  Outcome: Not Applicable Date Met:  06/03/65  Problem: Phase II Progression Outcomes Goal: Pain controlled on oral analgesia Outcome: Completed/Met Date Met:  12/15/13  Problem: Discharge Progression Outcomes Goal: Barriers To Progression Addressed/Resolved Outcome: Not Applicable Date Met:  88/93/38 Goal: Activity appropriate for discharge plan Outcome: Completed/Met Date Met:  82/66/66 Goal: Complications resolved/controlled Outcome: Not Applicable Date Met:  48/61/61 Goal: Pain controlled with appropriate interventions Outcome: Completed/Met Date Met:  12/15/13 Goal: Afebrile, VS remain stable at discharge Outcome: Completed/Met Date Met:  12/15/13 Goal: Discharge plan in place and appropriate Outcome: Completed/Met Date Met:  12/15/13 Goal: Other Discharge Outcomes/Goals Outcome: Not Applicable Date Met:  22/40/01

## 2013-12-15 NOTE — Lactation Note (Signed)
This note was copied from the chart of Heather Jasma Stice. Lactation Consultation Note      Follow up consult with this mom of a term baby, now 4139 hours old. The baby was asleep in her crib, and mom was getting ready to eat lunch. Mom reports brest feeding going well. She supplemented with formula once this morning, but only 5 mls. Breast care reviewed. Mom staying until tomorrow due to her GBS status.   Patient Name: Heather Obrien ZOXWR'UToday's Date: 12/15/2013 Reason for consult: Follow-up assessment   Maternal Data    Feeding    LATCH Score/Interventions                      Lactation Tools Discussed/Used     Consult Status Consult Status: Follow-up Date: 12/16/13 Follow-up type: In-patient    Alfred LevinsLee, Amberle Lyter Anne 12/15/2013, 12:57 PM

## 2013-12-15 NOTE — Progress Notes (Signed)
Clinical Social Work Department PSYCHOSOCIAL ASSESSMENT - MATERNAL/CHILD 12/15/2013  Patient:  Heather Obrien,Heather Obrien  Account Number:  [id number]  Admit Date:  12/13/2013  Childs Name:   Angel Obrien   Clinical Social Worker:  Clemmie Marxen, CLINICAL SOCIAL WORKER   Date/Time:  12/15/2013 10:00 AM  Date Referred:  12/15/2013   Referral source  [facility]     Referred reason  Substance Abuse   Other referral source:    I:  FAMILY / HOME ENVIRONMENT Child's legal guardian:  PARENT  Guardian - Name Guardian - Age Guardian - Address  Heather Obrien 23 [address] [place], [place] [address]  Heather Obrien  same as above   Other household support members/support persons Name Relationship DOB   DAUGHTER 3 years old   SON 1 year old   Other support:   MOB shared that her family lives in [place].  She stated that she lives with the FOB, the PGP, and her children. MOB identified them as supportive.    II  PSYCHOSOCIAL DATA Information Source:  Patient Interview  Financial and Community Resources Employment:   MOB stated that she is unemployed.   Financial resources:  Medicaid If Medicaid - County:  [place] Other  Food Stamps  WIC   School / Grade:  N/A Maternity Care Coordinator / Child Services Coordination / Early Interventions:   None reported  Cultural issues impacting care:   None reported.    III  STRENGTHS Strengths  Adequate Resources  Home prepared for Child (including basic supplies)  Supportive family/friends   Strength comment:  MOB reported goals to return to college in January 2016 to pursue a degree in psychology.   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  She reported last use "couple months ago".  Baby's UDS and MDS are pending.         V  SOCIAL WORK  ASSESSMENT CSW met with the MOB in her room in order to complete the assessment. Consult was ordered due to MOB presenting with a history of THC use during the pregnancy.  MOB became increasingly engaged as the assessment unfolded.  She maintained eye contact, and displayed an appropriate mood and affect.  She was vague regarding her substance use history, but did acknowledge THC use.  MOB presented as attentive and bonding with the baby.  She ended by expressing appreciation for visit and referrals for therapy.  CSW guided the MOB as she processed her thoughts and feelings related to her transition into the postpartum period. She expressed excitement upon the birth of her infant, but also expressed feeling overwhelmed since she will now be the mother of three children under the age of 3.  She disclosed the difficulties she often feels since she is living in [place] wit the FOB and his family and she misses her family that lives in [place].  Per MOB, she interacts with the FOB and his friends, but has had difficulties making her own connections as she does not work or go to school.  MOB expressed goal of going to school in January in order to pursue a degree in psychology.  She continued to process her feelings of loss since she has been unable to finish college due to having children.  MOB stated that she loves being a mother   and loves her children, but sometimes feels like she is missing out on normative experiences of a [age] year old.  MOB expressed motivation to pursue a degree in order to work and to provide for her children.  She shared belief that she is well supported by the FOB and her family, and that they will assist her pursue her goals.   MOB acknowledged THC use during the pregnancy.  She denied ability to recall last use, but acknowledged positive UDS in August.  She stated that her last use was "couple months ago".  Per MOB, she did not smoke every day, but she only smoked THC to assist  her to cope with her stress and relax.  MOB denied any awareness of negative outcomes related to this behavior.  MOB acknowledged hospital drug screen policy, and denied any questions or concerns related to potential CPS involvement if baby's MDS is positive.   CSW inquired about MOB's mental health.  MOB denied mental health history, but did endorse feeling sad during her pregnancy.  She recalled periods of feeling sad, tearful, with low motivation to get out of bed.  MOB expressed interest in referral for therapy since she shared belief that it would assist her to have sometime to talk about her feelings.  CSW provided education on postpartum depression, and MOB denied concerns related to the material.   No barriers to discharge.     VI SOCIAL WORK PLAN Social Work Plan  Information/Referral to Community Resources  Patient/Family Education  No Further Intervention Required / No Barriers to Discharge   Type of pt/family education:   Postpartum depression  Hospital drug screen policy   If child protective services report - county:   If child protective services report - date:   Information/referral to community resources comment:   MOB requested referrals for therapy. CSW provided list of therapy resources in [place]. CSW also provided information regarding [employer].    Other social work plan:   CSW to follow up PRN. CSW to monitor drug screens and will make CPS report if warranted.      

## 2014-02-04 ENCOUNTER — Encounter: Payer: Self-pay | Admitting: Obstetrics & Gynecology

## 2014-02-04 ENCOUNTER — Ambulatory Visit (INDEPENDENT_AMBULATORY_CARE_PROVIDER_SITE_OTHER): Payer: Medicaid Other | Admitting: Obstetrics & Gynecology

## 2014-02-04 VITALS — BP 115/78 | HR 66 | Temp 98.2°F | Resp 20 | Ht 64.0 in | Wt 115.5 lb

## 2014-02-04 DIAGNOSIS — Z30430 Encounter for insertion of intrauterine contraceptive device: Secondary | ICD-10-CM

## 2014-02-04 DIAGNOSIS — Z01812 Encounter for preprocedural laboratory examination: Secondary | ICD-10-CM

## 2014-02-04 LAB — POCT PREGNANCY, URINE: Preg Test, Ur: NEGATIVE

## 2014-02-04 MED ORDER — ETONOGESTREL 68 MG ~~LOC~~ IMPL
68.0000 mg | DRUG_IMPLANT | Freq: Once | SUBCUTANEOUS | Status: AC
  Administered 2014-02-04: 68 mg via SUBCUTANEOUS

## 2014-02-04 NOTE — Progress Notes (Signed)
  Subjective:     Heather Obrien is a S AA P3  24 y.o. female who presents for a postpartum visit. She is 6 weeks postpartum following a spontaneous vaginal delivery. I have fully reviewed the prenatal and intrapartum course. The delivery was at term gestational weeks. Outcome: spontaneous vaginal delivery. Anesthesia: none. Postpartum course has been uncomplicated. Baby's course has been uncomplicated. Baby is feeding by bottle - [place]. Bleeding no bleeding. Bowel function is normal. Bladder function is normal. Patient is sexually active. Contraception method is condoms. Postpartum depression screening: negative.  The following portions of the patient's history were reviewed and updated as appropriate: allergies, current medications, past family history, past medical history, past social history, past surgical history and problem list.  Review of Systems A comprehensive review of systems was negative.   Objective:    BP 115/78 mmHg  Pulse 66  Temp(Src) 98.2 F (36.8 C) (Oral)  Resp 20  Ht 5\' 4"  (1.626 m)  Wt 115 lb 8 oz (52.39 kg)  BMI 19.82 kg/m2  Breastfeeding? No   UPT was negative. Consent was signed. Time out procedure was done. Her left arm was prepped with betadine and infiltrated with 3 cc of 1% lidocaine. After adequate anesthesia was assured, the Nexplanon device was placed according to standard of care. Her arm was hemostatic and was bandaged. She tolerated the procedure well.  General:  alert   Breasts:  inspection negative, no nipple discharge or bleeding, no masses or nodularity palpable  Lungs: clear to auscultation bilaterally  Heart:  regular rate and rhythm, S1, S2 normal, no murmur, click, rub or gallop  Abdomen: soft, non-tender; bowel sounds normal; no masses,  no organomegaly   Vulva:  normal  Vagina: not evaluated  Cervix:  anteverted  Corpus: normal  Adnexa:  normal adnexa  Rectal Exam: Not performed.        Assessment:     Normal postpartum exam.  Pap smear not done at today's visit.   Plan:    1. Contraception: Nexplanon

## 2014-02-06 ENCOUNTER — Encounter: Payer: Self-pay | Admitting: *Deleted
